# Patient Record
Sex: Female | Born: 1985
Health system: Southern US, Community
[De-identification: ages and names within clinical notes are randomized; demographics above are authoritative.]

## PROBLEM LIST (undated history)

## (undated) DIAGNOSIS — J449 Chronic obstructive pulmonary disease, unspecified: Secondary | ICD-10-CM

## (undated) DIAGNOSIS — T7840XA Allergy, unspecified, initial encounter: Secondary | ICD-10-CM

## (undated) DIAGNOSIS — I10 Essential (primary) hypertension: Secondary | ICD-10-CM

## (undated) DIAGNOSIS — Z720 Tobacco use: Secondary | ICD-10-CM

## (undated) DIAGNOSIS — K279 Peptic ulcer, site unspecified, unspecified as acute or chronic, without hemorrhage or perforation: Secondary | ICD-10-CM

## (undated) DIAGNOSIS — K219 Gastro-esophageal reflux disease without esophagitis: Secondary | ICD-10-CM

## (undated) DIAGNOSIS — I420 Dilated cardiomyopathy: Secondary | ICD-10-CM

## (undated) DIAGNOSIS — B019 Varicella without complication: Secondary | ICD-10-CM

## (undated) DIAGNOSIS — F1910 Other psychoactive substance abuse, uncomplicated: Secondary | ICD-10-CM

## (undated) DIAGNOSIS — G4730 Sleep apnea, unspecified: Secondary | ICD-10-CM

## (undated) DIAGNOSIS — J45909 Unspecified asthma, uncomplicated: Secondary | ICD-10-CM

## (undated) DIAGNOSIS — I509 Heart failure, unspecified: Secondary | ICD-10-CM

## (undated) DIAGNOSIS — F419 Anxiety disorder, unspecified: Secondary | ICD-10-CM

## (undated) DIAGNOSIS — F32A Depression, unspecified: Secondary | ICD-10-CM

## (undated) DIAGNOSIS — G709 Myoneural disorder, unspecified: Secondary | ICD-10-CM

## (undated) HISTORY — DX: Unspecified asthma, uncomplicated: J45.909

## (undated) HISTORY — DX: Depression, unspecified: F32.A

## (undated) HISTORY — DX: Chronic obstructive pulmonary disease, unspecified: J44.9

## (undated) HISTORY — DX: Varicella without complication: B01.9

## (undated) HISTORY — DX: Other psychoactive substance abuse, uncomplicated: F19.10

## (undated) HISTORY — PX: FOOT SURGERY: SHX648

## (undated) HISTORY — DX: Myoneural disorder, unspecified: G70.9

## (undated) HISTORY — DX: Tobacco use: Z72.0

## (undated) HISTORY — DX: Heart failure, unspecified: I50.9

## (undated) HISTORY — DX: Allergy, unspecified, initial encounter: T78.40XA

## (undated) HISTORY — DX: Anxiety disorder, unspecified: F41.9

## (undated) HISTORY — DX: Sleep apnea, unspecified: G47.30

## (undated) HISTORY — DX: Essential (primary) hypertension: I10

## (undated) HISTORY — DX: Gastro-esophageal reflux disease without esophagitis: K21.9

---
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
## 2005-01-30 ENCOUNTER — Ambulatory Visit: Payer: Self-pay | Admitting: Family Medicine

## 2005-01-31 ENCOUNTER — Ambulatory Visit: Payer: Self-pay | Admitting: Family Medicine

## 2005-03-30 ENCOUNTER — Ambulatory Visit: Payer: Self-pay | Admitting: Family Medicine

## 2005-05-12 ENCOUNTER — Ambulatory Visit: Payer: Self-pay | Admitting: Family Medicine

## 2005-12-05 ENCOUNTER — Emergency Department (HOSPITAL_COMMUNITY): Admission: EM | Admit: 2005-12-05 | Discharge: 2005-12-05 | Payer: Self-pay | Admitting: *Deleted

## 2005-12-12 ENCOUNTER — Emergency Department: Payer: Self-pay | Admitting: Emergency Medicine

## 2005-12-14 ENCOUNTER — Emergency Department: Payer: Self-pay | Admitting: Emergency Medicine

## 2006-07-07 ENCOUNTER — Emergency Department: Payer: Self-pay | Admitting: Emergency Medicine

## 2006-09-04 ENCOUNTER — Emergency Department: Payer: Self-pay | Admitting: Emergency Medicine

## 2006-11-14 ENCOUNTER — Emergency Department: Payer: Self-pay | Admitting: Emergency Medicine

## 2006-12-22 ENCOUNTER — Emergency Department: Payer: Self-pay | Admitting: Emergency Medicine

## 2007-08-01 ENCOUNTER — Emergency Department: Payer: Self-pay | Admitting: Emergency Medicine

## 2007-10-24 ENCOUNTER — Emergency Department: Payer: Self-pay | Admitting: Emergency Medicine

## 2008-01-14 ENCOUNTER — Emergency Department: Payer: Self-pay | Admitting: Emergency Medicine

## 2008-01-19 ENCOUNTER — Emergency Department: Payer: Self-pay | Admitting: Emergency Medicine

## 2008-02-14 ENCOUNTER — Ambulatory Visit: Payer: Self-pay | Admitting: Family Medicine

## 2008-02-14 DIAGNOSIS — Z720 Tobacco use: Secondary | ICD-10-CM | POA: Insufficient documentation

## 2008-02-14 DIAGNOSIS — F17200 Nicotine dependence, unspecified, uncomplicated: Secondary | ICD-10-CM | POA: Insufficient documentation

## 2008-02-14 LAB — CONVERTED CEMR LAB
BUN: 11 mg/dL (ref 6–23)
Basophils Absolute: 0.1 10*3/uL (ref 0.0–0.1)
Eosinophils Absolute: 0.4 10*3/uL (ref 0.0–0.7)
Eosinophils Relative: 6.7 % — ABNORMAL HIGH (ref 0.0–5.0)
GFR calc Af Amer: 161 mL/min
GFR calc non Af Amer: 133 mL/min
HCT: 38.8 % (ref 36.0–46.0)
MCHC: 34.7 g/dL (ref 30.0–36.0)
MCV: 91.7 fL (ref 78.0–100.0)
Monocytes Absolute: 0.5 10*3/uL (ref 0.1–1.0)
Neutrophils Relative %: 60.1 % (ref 43.0–77.0)
Platelets: 278 10*3/uL (ref 150–400)
Potassium: 3.6 meq/L (ref 3.5–5.1)
RDW: 12.2 % (ref 11.5–14.6)
TSH: 0.91 microintl units/mL (ref 0.35–5.50)

## 2008-03-06 DIAGNOSIS — J301 Allergic rhinitis due to pollen: Secondary | ICD-10-CM | POA: Insufficient documentation

## 2008-07-22 ENCOUNTER — Emergency Department: Payer: Self-pay | Admitting: Emergency Medicine

## 2009-08-14 ENCOUNTER — Emergency Department: Payer: Self-pay | Admitting: Emergency Medicine

## 2009-10-16 ENCOUNTER — Emergency Department: Payer: Self-pay | Admitting: Internal Medicine

## 2009-11-29 ENCOUNTER — Encounter (INDEPENDENT_AMBULATORY_CARE_PROVIDER_SITE_OTHER): Payer: Self-pay | Admitting: *Deleted

## 2010-05-26 NOTE — Letter (Signed)
Summary: Nadara Eaton letter  [place] [facility]  [address] [place], [place] [address]   Phone: [phone]  Fax: [phone]       11/29/2009 MRN: [id number]  [facility] [address] [place], [place]  [address]  Dear Ms. Ass[facility] [place], and [place] announce the retirement of Arta Silence, M.D., from full-time practice at the [facility] office effective October 21, 2009 and his plans of returning part-time.  It is important to Dr. Hetty Ely and to our practice that you understand that [facility] has seven physicians in our office for your health care needs.  We will continue to offer the same exceptional care that you have today.    Dr. Hetty Ely has spoken to many of you about his plans for retirement and returning part-time in the fall.   We will continue to work with you through the transition to schedule appointments for you in the office and meet the high standards that [place] is committed to.   Again, it is with great pleasure that we share the news that Dr. Hetty Ely will return to [facility] in October of 2011 with a reduced schedule.    If you have any questions, or would like to request an appointment with one of our physicians, please call us at [phone] and press the option for Scheduling an appointment.  We take pleasure in providing you with excellent patient care and look forward to seeing you at your next office visit.  Our [facility] Physicians are:  Tillman Abide, M.D. Laurita Quint, M.D. Roxy Manns, M.D. Kerby Nora, M.D. Hannah Beat, M.D. Ruthe Mannan, M.D. We proudly welcomed Raechel Ache, M.D. and Eustaquio Boyden, M.D. to the practice in July/August 2011.  Sincerely,  [place] [facility]

## 2012-09-25 ENCOUNTER — Emergency Department: Payer: Self-pay | Admitting: Emergency Medicine

## 2013-02-20 ENCOUNTER — Emergency Department: Payer: Self-pay | Admitting: Emergency Medicine

## 2013-02-20 LAB — URINALYSIS, COMPLETE
Glucose,UR: NEGATIVE mg/dL (ref 0–75)
Nitrite: NEGATIVE
Ph: 6 (ref 4.5–8.0)
RBC,UR: <1 /HPF (ref 0–5)
Squamous Epithelial: NONE SEEN

## 2013-02-22 LAB — BETA STREP CULTURE(ARMC)

## 2013-04-18 ENCOUNTER — Emergency Department: Payer: Self-pay | Admitting: Emergency Medicine

## 2013-06-11 ENCOUNTER — Emergency Department: Payer: Self-pay | Admitting: Emergency Medicine

## 2013-06-13 LAB — BETA STREP CULTURE(ARMC)

## 2013-07-31 ENCOUNTER — Emergency Department: Payer: Self-pay | Admitting: Emergency Medicine

## 2013-07-31 LAB — CBC WITH DIFFERENTIAL/PLATELET
BASOS ABS: 0.1 10*3/uL (ref 0.0–0.1)
Basophil %: 0.9 %
Eosinophil #: 0.6 10*3/uL (ref 0.0–0.7)
Eosinophil %: 4.7 %
HCT: 44.9 % (ref 35.0–47.0)
HGB: 14.6 g/dL (ref 12.0–16.0)
LYMPHS PCT: 23.5 %
Lymphocyte #: 2.9 10*3/uL (ref 1.0–3.6)
MCH: 30.1 pg (ref 26.0–34.0)
MCHC: 32.4 g/dL (ref 32.0–36.0)
MCV: 93 fL (ref 80–100)
Monocyte #: 0.8 x10 3/mm (ref 0.2–0.9)
Monocyte %: 6.9 %
Neutrophil #: 7.8 10*3/uL — ABNORMAL HIGH (ref 1.4–6.5)
Neutrophil %: 64 %
PLATELETS: 259 10*3/uL (ref 150–440)
RBC: 4.84 10*6/uL (ref 3.80–5.20)
RDW: 13.4 % (ref 11.5–14.5)
WBC: 12.2 10*3/uL — ABNORMAL HIGH (ref 3.6–11.0)

## 2013-07-31 LAB — COMPREHENSIVE METABOLIC PANEL
ALBUMIN: 4.4 g/dL (ref 3.4–5.0)
ALK PHOS: 73 U/L
ALT: 19 U/L (ref 12–78)
ANION GAP: 6 — AB (ref 7–16)
AST: 22 U/L (ref 15–37)
BILIRUBIN TOTAL: 0.2 mg/dL (ref 0.2–1.0)
BUN: 18 mg/dL (ref 7–18)
CALCIUM: 9 mg/dL (ref 8.5–10.1)
CHLORIDE: 109 mmol/L — AB (ref 98–107)
CO2: 23 mmol/L (ref 21–32)
Creatinine: 0.6 mg/dL (ref 0.60–1.30)
EGFR (Non-African Amer.): >60
GLUCOSE: 97 mg/dL (ref 65–99)
Osmolality: 277 (ref 275–301)
POTASSIUM: 3.8 mmol/L (ref 3.5–5.1)
SODIUM: 138 mmol/L (ref 136–145)
TOTAL PROTEIN: 8.1 g/dL (ref 6.4–8.2)

## 2013-07-31 LAB — LIPASE, BLOOD: Lipase: 100 U/L (ref 73–393)

## 2013-08-01 LAB — URINALYSIS, COMPLETE
Bilirubin,UR: NEGATIVE
Blood: NEGATIVE
GLUCOSE, UR: NEGATIVE mg/dL (ref 0–75)
Ketone: NEGATIVE
Nitrite: NEGATIVE
PH: 5 (ref 4.5–8.0)
PROTEIN: NEGATIVE
Specific Gravity: 1.025 (ref 1.003–1.030)
WBC UR: 4 /HPF (ref 0–5)

## 2013-10-21 ENCOUNTER — Emergency Department: Payer: Self-pay | Admitting: Emergency Medicine

## 2014-02-11 ENCOUNTER — Emergency Department: Payer: Self-pay | Admitting: Student

## 2014-07-02 ENCOUNTER — Emergency Department: Payer: Self-pay | Admitting: Emergency Medicine

## 2014-10-07 ENCOUNTER — Other Ambulatory Visit: Payer: Self-pay

## 2014-10-07 ENCOUNTER — Emergency Department: Payer: Self-pay

## 2014-10-07 ENCOUNTER — Encounter: Payer: Self-pay | Admitting: General Practice

## 2014-10-07 ENCOUNTER — Emergency Department
Admission: EM | Admit: 2014-10-07 | Discharge: 2014-10-07 | Disposition: A | Discharge status: Home / Self Care | Payer: Self-pay | Attending: Emergency Medicine | Admitting: Emergency Medicine

## 2014-10-07 DIAGNOSIS — Z3202 Encounter for pregnancy test, result negative: Secondary | ICD-10-CM | POA: Insufficient documentation

## 2014-10-07 DIAGNOSIS — R109 Unspecified abdominal pain: Secondary | ICD-10-CM | POA: Insufficient documentation

## 2014-10-07 DIAGNOSIS — Z720 Tobacco use: Secondary | ICD-10-CM | POA: Insufficient documentation

## 2014-10-07 LAB — URINALYSIS COMPLETE WITH MICROSCOPIC (ARMC ONLY)
Bacteria, UA: NONE SEEN
Bilirubin Urine: NEGATIVE
Glucose, UA: NEGATIVE mg/dL
LEUKOCYTES UA: NEGATIVE
NITRITE: NEGATIVE
PH: 6 (ref 5.0–8.0)
Protein, ur: NEGATIVE mg/dL
Specific Gravity, Urine: 1.023 (ref 1.005–1.030)
WBC, UA: NONE SEEN WBC/hpf (ref 0–5)

## 2014-10-07 LAB — TROPONIN I

## 2014-10-07 LAB — COMPREHENSIVE METABOLIC PANEL
ALK PHOS: 47 U/L (ref 38–126)
ALT: 14 U/L (ref 14–54)
ANION GAP: 9 (ref 5–15)
AST: 15 U/L (ref 15–41)
Albumin: 4.3 g/dL (ref 3.5–5.0)
BUN: 18 mg/dL (ref 6–20)
CO2: 25 mmol/L (ref 22–32)
CREATININE: 0.64 mg/dL (ref 0.44–1.00)
Calcium: 9.1 mg/dL (ref 8.9–10.3)
Chloride: 107 mmol/L (ref 101–111)
GFR calc non Af Amer: >60 mL/min (ref >60)
Glucose, Bld: 91 mg/dL (ref 65–99)
Potassium: 3.8 mmol/L (ref 3.5–5.1)
SODIUM: 141 mmol/L (ref 135–145)
TOTAL PROTEIN: 7.9 g/dL (ref 6.5–8.1)
Total Bilirubin: 0.5 mg/dL (ref 0.3–1.2)

## 2014-10-07 LAB — CBC WITH DIFFERENTIAL/PLATELET
Basophils Absolute: 0.1 10*3/uL (ref 0–0.1)
Basophils Relative: 1 %
EOS ABS: 0.3 10*3/uL (ref 0–0.7)
Eosinophils Relative: 3 %
HCT: 42.1 % (ref 35.0–47.0)
Hemoglobin: 14.2 g/dL (ref 12.0–16.0)
LYMPHS ABS: 1.7 10*3/uL (ref 1.0–3.6)
Lymphocytes Relative: 16 %
MCH: 31.8 pg (ref 26.0–34.0)
MCHC: 33.6 g/dL (ref 32.0–36.0)
MCV: 94.5 fL (ref 80.0–100.0)
MONOS PCT: 7 %
Monocytes Absolute: 0.7 10*3/uL (ref 0.2–0.9)
NEUTROS ABS: 7.7 10*3/uL — AB (ref 1.4–6.5)
Neutrophils Relative %: 73 %
PLATELETS: 231 10*3/uL (ref 150–440)
RBC: 4.45 MIL/uL (ref 3.80–5.20)
RDW: 13.7 % (ref 11.5–14.5)
WBC: 10.5 10*3/uL (ref 3.6–11.0)

## 2014-10-07 LAB — POCT PREGNANCY, URINE: Preg Test, Ur: NEGATIVE

## 2014-10-07 MED ORDER — DICYCLOMINE HCL 10 MG/ML IM SOLN
20.0000 mg | Freq: Once | INTRAMUSCULAR | Status: DC
  Filled 2014-10-07: qty 2

## 2014-10-07 MED ORDER — DICYCLOMINE HCL 20 MG PO TABS: 20.0000 mg | ORAL_TABLET | Freq: Three times a day (TID) | ORAL | Status: DC | PRN

## 2014-10-07 MED ORDER — SUCRALFATE 1 G PO TABS: 1.0000 g | ORAL_TABLET | Freq: Four times a day (QID) | ORAL | Status: DC

## 2014-10-07 MED ORDER — GI COCKTAIL ~~LOC~~
ORAL | Status: AC
  Administered 2014-10-07: 30 mL via ORAL
  Filled 2014-10-07: qty 30

## 2014-10-07 MED ORDER — DICYCLOMINE HCL 10 MG PO CAPS: 20.0000 mg | ORAL_CAPSULE | Freq: Once | ORAL | Status: AC

## 2014-10-07 MED ORDER — DICYCLOMINE HCL 20 MG PO TABS
ORAL_TABLET | ORAL | Status: AC
  Administered 2014-10-07: 20 mg
  Filled 2014-10-07: qty 1

## 2014-10-07 MED ORDER — RANITIDINE HCL 150 MG PO TABS: 150.0000 mg | ORAL_TABLET | Freq: Two times a day (BID) | ORAL | Status: DC

## 2014-10-07 MED ORDER — GI COCKTAIL ~~LOC~~
30.0000 mL | Freq: Once | ORAL | Status: AC
  Administered 2014-10-07: 30 mL via ORAL

## 2014-10-07 NOTE — ED Notes (Signed)
Pt returned from xray

## 2014-10-07 NOTE — ED Provider Notes (Signed)
[facility] Emergency Department Provider Note  ____________________________________________  Time seen: 69  I have reviewed the triage vital signs and the nursing notes.   HISTORY  Chief Complaint Abdominal Pain; Flank Pain; Shoulder Pain; Nausea; and Emesis   History limited by: Not Limited   HPI Bonnie Mathews is a 29 y.o. female who presents to the emergency department today because of left upper quadrant pain. The patient states that the pain started one month ago, has been fairly constant since then. It is worse when she eats. She will have immediate pain. She will proceed to throw up what she just ate. The pain radiates up to her shoulder and back. She has had slightly more watery stools since the pain started. Denies any fevers.       History reviewed. No pertinent past medical history.  Patient Active Problem List   Diagnosis Date Noted  . ALLERGIC RHINITIS, SEASONAL 03/06/2008  . TOBACCO ABUSE 02/14/2008    History reviewed. No pertinent past surgical history.  No current outpatient prescriptions on file.  Allergies Doxycycline  No family history on file.  Social History History  Substance Use Topics  . Smoking status: Current Every Day Smoker -- 1.00 packs/day    Types: Cigarettes  . Smokeless tobacco: Not on file  . Alcohol Use: 6.0 oz/week    10 Cans of beer per week    Review of Systems   Constitutional: Negative for fever. Cardiovascular: Negative for chest pain. Respiratory: Negative for shortness of breath. Gastrointestinal: Positive for left upper quadrant pain Genitourinary: Negative for dysuria. Musculoskeletal: Negative for back pain. Skin: Negative for rash. Neurological: Negative for headaches, focal weakness or numbness.    10-point ROS otherwise negative.  ____________________________________________   PHYSICAL EXAM:  VITAL SIGNS: ED Triage Vitals  Enc Vitals Group     BP 10/07/14 1710  125/76 mmHg     Pulse Rate 10/07/14 1710 83     Resp 10/07/14 1710 18     Temp 10/07/14 1710 97.8 F (36.6 C)     Temp Source 10/07/14 1710 Oral     SpO2 10/07/14 1710 100 %     Weight 10/07/14 1710 169 lb (76.658 kg)     Height 10/07/14 1710 5\' 3"  (1.6 m)     Head Cir --      Peak Flow --      Pain Score 10/07/14 1711 7   Constitutional: Alert and oriented. Well appearing and in no distress. Eyes: Conjunctivae are normal. PERRL. Normal extraocular movements. ENT   Head: Normocephalic and atraumatic.   Nose: No congestion/rhinnorhea.   Mouth/Throat: Mucous membranes are moist.   Neck: No stridor. Hematological/Lymphatic/Immunilogical: No cervical lymphadenopathy. Cardiovascular: Normal rate, regular rhythm.  No murmurs, rubs, or gallops. Respiratory: Normal respiratory effort without tachypnea nor retractions. Breath sounds are clear and equal bilaterally. No wheezes/rales/rhonchi. Gastrointestinal: Soft, tender to palpation in the left upper quadrant. No rebound. No guarding.  Genitourinary: Deferred Musculoskeletal: Normal range of motion in all extremities. No joint effusions.  No lower extremity tenderness nor edema. Neurologic:  Normal speech and language. No gross focal neurologic deficits are appreciated. Speech is normal.  Skin:  Skin is warm, dry and intact. No rash noted. Psychiatric: Mood and affect are normal. Speech and behavior are normal. Patient exhibits appropriate insight and judgment.  ____________________________________________    LABS (pertinent positives/negatives)  Labs Reviewed  CBC WITH DIFFERENTIAL/PLATELET - Abnormal; Notable for the following:    Neutro Abs 7.7 (*)  All other components within normal limits  URINALYSIS COMPLETEWITH MICROSCOPIC ([facility] ONLY) - Abnormal; Notable for the following:    Color, Urine YELLOW (*)    APPearance CLEAR (*)    Ketones, ur TRACE (*)    Hgb urine dipstick 2+ (*)    Squamous Epithelial / LPF 0-5  (*)    All other components within normal limits  COMPREHENSIVE METABOLIC PANEL  TROPONIN I  POC URINE PREG, ED  POCT PREGNANCY, URINE    ____________________________________________   EKG  I, Bonnie Mathews, attending physician, personally viewed and interpreted this EKG  EKG Time: 1718 Rate: 83 Rhythm: NSR Axis: normal Intervals: qtc 451 QRS: narrow ST changes: no st elevation    ____________________________________________    RADIOLOGY  Abdominal x-ray IMPRESSION: No plain film findings for an acute abdominal process.  ____________________________________________   PROCEDURES  Procedure(s) performed: None  Critical Care performed: No  ____________________________________________   INITIAL IMPRESSION / ASSESSMENT AND PLAN / ED COURSE  Pertinent labs & imaging results that were available during my care of the patient were reviewed by me and considered in my medical decision making (see chart for details).  Patient presents to the emergency department with left upper quadrant pain. She states the pain is worse after eating. Bedside ultrasound did not show any gallstones, gallbladder wall thickening or pericholecystic fluid. 2 abdominal x-ray was revealed without any concerning findings. Patient did not have leukocytosis. Will trial patient on medications. Did have discussion with patient about return precautions.  ____________________________________________   FINAL CLINICAL IMPRESSION(S) / ED DIAGNOSES  Final diagnoses:  Abdominal pain, unspecified abdominal location     Bonnie Pear, MD 10/07/14 2000

## 2014-10-07 NOTE — Discharge Instructions (Signed)
Please seek medical attention for any high fevers, chest pain, shortness of breath, change in behavior, persistent vomiting, bloody stool or any other new or concerning symptoms.  Abdominal Pain, Women Abdominal (stomach, pelvic, or belly) pain can be caused by many things. It is important to tell your doctor:  The location of the pain.  Does it come and go or is it present all the time?  Are there things that start the pain (eating certain foods, exercise)?  Are there other symptoms associated with the pain (fever, nausea, vomiting, diarrhea)? All of this is helpful to know when trying to find the cause of the pain. CAUSES   Stomach: virus or bacteria infection, or ulcer.  Intestine: appendicitis (inflamed appendix), regional ileitis (Crohn's disease), ulcerative colitis (inflamed colon), irritable bowel syndrome, diverticulitis (inflamed diverticulum of the colon), or cancer of the stomach or intestine.  Gallbladder disease or stones in the gallbladder.  Kidney disease, kidney stones, or infection.  Pancreas infection or cancer.  Fibromyalgia (pain disorder).  Diseases of the female organs:  Uterus: fibroid (non-cancerous) tumors or infection.  Fallopian tubes: infection or tubal pregnancy.  Ovary: cysts or tumors.  Pelvic adhesions (scar tissue).  Endometriosis (uterus lining tissue growing in the pelvis and on the pelvic organs).  Pelvic congestion syndrome (female organs filling up with blood just before the menstrual period).  Pain with the menstrual period.  Pain with ovulation (producing an egg).  Pain with an IUD (intrauterine device, birth control) in the uterus.  Cancer of the female organs.  Functional pain (pain not caused by a disease, may improve without treatment).  Psychological pain.  Depression. DIAGNOSIS  Your doctor will decide the seriousness of your pain by doing an examination.  Blood tests.  X-rays.  Ultrasound.  CT scan  (computed tomography, special type of X-ray).  MRI (magnetic resonance imaging).  Cultures, for infection.  Barium enema (dye inserted in the large intestine, to better view it with X-rays).  Colonoscopy (looking in intestine with a lighted tube).  Laparoscopy (minor surgery, looking in abdomen with a lighted tube).  Major abdominal exploratory surgery (looking in abdomen with a large incision). TREATMENT  The treatment will depend on the cause of the pain.   Many cases can be observed and treated at home.  Over-the-counter medicines recommended by your caregiver.  Prescription medicine.  Antibiotics, for infection.  Birth control pills, for painful periods or for ovulation pain.  Hormone treatment, for endometriosis.  Nerve blocking injections.  Physical therapy.  Antidepressants.  Counseling with a psychologist or psychiatrist.  Minor or major surgery. HOME CARE INSTRUCTIONS   Do not take laxatives, unless directed by your caregiver.  Take over-the-counter pain medicine only if ordered by your caregiver. Do not take aspirin because it can cause an upset stomach or bleeding.  Try a clear liquid diet (broth or water) as ordered by your caregiver. Slowly move to a bland diet, as tolerated, if the pain is related to the stomach or intestine.  Have a thermometer and take your temperature several times a day, and record it.  Bed rest and sleep, if it helps the pain.  Avoid sexual intercourse, if it causes pain.  Avoid stressful situations.  Keep your follow-up appointments and tests, as your caregiver orders.  If the pain does not go away with medicine or surgery, you may try:  Acupuncture.  Relaxation exercises (yoga, meditation).  Group therapy.  Counseling. SEEK MEDICAL CARE IF:   You notice certain foods cause stomach  pain.  Your home care treatment is not helping your pain.  You need stronger pain medicine.  You want your IUD removed.  You  feel faint or lightheaded.  You develop nausea and vomiting.  You develop a rash.  You are having side effects or an allergy to your medicine. SEEK IMMEDIATE MEDICAL CARE IF:   Your pain does not go away or gets worse.  You have a fever.  Your pain is felt only in portions of the abdomen. The right side could possibly be appendicitis. The left lower portion of the abdomen could be colitis or diverticulitis.  You are passing blood in your stools (bright red or black tarry stools, with or without vomiting).  You have blood in your urine.  You develop chills, with or without a fever.  You pass out. MAKE SURE YOU:   Understand these instructions.  Will watch your condition.  Will get help right away if you are not doing well or get worse. Document Released: 02/05/2007 Document Revised: 08/25/2013 Document Reviewed: 02/25/2009 [facility] Patient Information 2015 [place], [place]. This information is not intended to replace advice given to you by your health care provider. Make sure you discuss any questions you have with your health care provider.

## 2014-10-07 NOTE — ED Notes (Signed)
Pt alert and in NAD at time of d/c 

## 2014-10-07 NOTE — ED Notes (Signed)
Pt. Arrived to ed from home with reports of experiencing LLQ pain that radiates to left lower back and left shoulder. Pt reports intermittent pain x months. Pt alert and oriented. Pt reports experiencing N/V over the last months with pain. Pt describes pain as sharp/stabbing pain.

## 2014-10-09 ENCOUNTER — Encounter: Payer: Self-pay | Admitting: Emergency Medicine

## 2014-10-09 ENCOUNTER — Emergency Department
Admission: EM | Admit: 2014-10-09 | Discharge: 2014-10-09 | Disposition: A | Discharge status: Home / Self Care | Payer: Self-pay | Attending: Emergency Medicine | Admitting: Emergency Medicine

## 2014-10-09 DIAGNOSIS — Z720 Tobacco use: Secondary | ICD-10-CM | POA: Insufficient documentation

## 2014-10-09 DIAGNOSIS — M7631 Iliotibial band syndrome, right leg: Secondary | ICD-10-CM | POA: Insufficient documentation

## 2014-10-09 DIAGNOSIS — Z79899 Other long term (current) drug therapy: Secondary | ICD-10-CM | POA: Insufficient documentation

## 2014-10-09 MED ORDER — PREDNISONE 10 MG PO TABS: 10.0000 mg | ORAL_TABLET | Freq: Two times a day (BID) | ORAL | Status: DC

## 2014-10-09 MED ORDER — KETOROLAC TROMETHAMINE 60 MG/2ML IM SOLN
60.0000 mg | Freq: Once | INTRAMUSCULAR | Status: AC
  Administered 2014-10-09: 60 mg via INTRAMUSCULAR

## 2014-10-09 MED ORDER — KETOROLAC TROMETHAMINE 60 MG/2ML IM SOLN
INTRAMUSCULAR | Status: AC
  Filled 2014-10-09: qty 2

## 2014-10-09 MED ORDER — CYCLOBENZAPRINE HCL 5 MG PO TABS: 5.0000 mg | ORAL_TABLET | Freq: Three times a day (TID) | ORAL | Status: DC | PRN

## 2014-10-09 MED ORDER — ORPHENADRINE CITRATE 30 MG/ML IJ SOLN
INTRAMUSCULAR | Status: AC
  Administered 2014-10-09: 60 mg via INTRAMUSCULAR
  Filled 2014-10-09: qty 2

## 2014-10-09 MED ORDER — ORPHENADRINE CITRATE 30 MG/ML IJ SOLN
60.0000 mg | INTRAMUSCULAR | Status: AC
  Administered 2014-10-09: 60 mg via INTRAMUSCULAR

## 2014-10-09 NOTE — ED Notes (Signed)
Patient to ED with c/o right upper leg pain radiating from hip down to knee, reports pain started in knee and now is worse in hip. Patient reports movement makes hip pain worse but she is unsure of any injury.

## 2014-10-09 NOTE — ED Provider Notes (Signed)
[facility] Emergency Department Provider Note ____________________________________________  Time seen: 1824  I have reviewed the triage vital signs and the nursing notes.  HISTORY  Chief Complaint  Leg Pain  HPI Bonnie Mathews is a 29 y.o. female reports to the ED with pain on the lateral aspect of the right thigh since this morning. She denies injury, trauma, or accident. She noted onset after awakening this morning, after she had driven her brother to work and returned home. She notes increased pain with transitioning from sitting to standing, but denies any catch, click, LOC, or give way in the hip or knee joints.She notes the pain is from the right hip pointer to the lateral aspect of the right knee. She is without urinary incontinence or foot drop.  History reviewed. No pertinent past medical history.  Patient Active Problem List   Diagnosis Date Noted  . ALLERGIC RHINITIS, SEASONAL 03/06/2008  . TOBACCO ABUSE 02/14/2008   History reviewed. No pertinent past surgical history.  Current Outpatient Rx  Name  Route  Sig  Dispense  Refill  . cyclobenzaprine (FLEXERIL) 5 MG tablet   Oral   Take 1 tablet (5 mg total) by mouth every 8 (eight) hours as needed for muscle spasms.   12 tablet   0   . dicyclomine (BENTYL) 20 MG tablet   Oral   Take 1 tablet (20 mg total) by mouth 3 (three) times daily as needed for spasms.   30 tablet   0   . predniSONE (DELTASONE) 10 MG tablet   Oral   Take 1 tablet (10 mg total) by mouth 2 (two) times daily with a meal.   10 tablet   0   . ranitidine (ZANTAC) 150 MG tablet   Oral   Take 1 tablet (150 mg total) by mouth 2 (two) times daily.   60 tablet   1   . sucralfate (CARAFATE) 1 G tablet   Oral   Take 1 tablet (1 g total) by mouth 4 (four) times daily.   60 tablet   1    Allergies Doxycycline  History reviewed. No pertinent family history.  Social History History  Substance Use Topics  . Smoking  status: Current Every Day Smoker -- 1.00 packs/day    Types: Cigarettes  . Smokeless tobacco: Not on file  . Alcohol Use: 6.0 oz/week    10 Cans of beer per week   Review of Systems  Constitutional: Negative for fever. Eyes: Negative for visual changes. ENT: Negative for sore throat. Cardiovascular: Negative for chest pain. Respiratory: Negative for shortness of breath. Gastrointestinal: Negative for abdominal pain, vomiting and diarrhea. Genitourinary: Negative for dysuria. Musculoskeletal: Negative for back pain. Right thigh pain. Skin: Negative for rash. Neurological: Negative for headaches, focal weakness or numbness. ____________________________________________  PHYSICAL EXAM:  VITAL SIGNS: ED Triage Vitals  Enc Vitals Group     BP 10/09/14 1648 104/72 mmHg     Pulse Rate 10/09/14 1648 88     Resp 10/09/14 1648 20     Temp 10/09/14 1648 98.2 F (36.8 C)     Temp Source 10/09/14 1648 Oral     SpO2 10/09/14 1648 99 %     Weight 10/09/14 1648 169 lb (76.658 kg)     Height 10/09/14 1648 5\' 3"  (1.6 m)     Head Cir --      Peak Flow --      Pain Score 10/09/14 1648 8     Pain  Loc --      Pain Edu? --      Excl. in [place]? --    Constitutional: Alert and oriented. Well appearing and in no distress. Eyes: Normocephalic and atraumatic. Conjunctivae are normal. PERRL. Normal extraocular movements. No congestion/rhinnorhea. Mucous membranes are moist. Cardiovascular: Normal rate, regular rhythm.  Respiratory: Normal respiratory effort. No wheezes/rales/rhonchi. Gastrointestinal: Soft and nontender. No distention. Musculoskeletal: Normal spinal exam. Normal appearance of the right leg without deformity, bruising, swelling, or joint effusion. Tenderness to palp along the IT band on the right thigh. Self-limited hip flexion & extension, normal right knee ROM.  Neurologic:  Normal gait without ataxia. Normal speech and language. No gross focal neurologic deficits are appreciated.  Normal LE DTRs bilaterally. Negative SLR right.  Skin:  Skin is warm, dry and intact. No rash noted. Psychiatric: Mood and affect are normal. Patient exhibits appropriate insight and judgment. ____________________________________________  PROCEDURES  Toradol 60 mg IM Norflex 60 mg IM ____________________________________________  INITIAL IMPRESSION / ASSESSMENT AND PLAN / ED COURSE Acute right ITB tendinitis presentation. Treatment with Toradol, Flexeril, and stretches.  Follow-up with primary provider or ortho as needed.  ____________________________________________  FINAL CLINICAL IMPRESSION(S) / ED DIAGNOSES  Final diagnoses:  Tendinitis of iliotibial band, right     Melvenia Needles, PA-C 10/09/14 2042  Carrie Mew, MD 10/09/14 [phone]

## 2014-10-09 NOTE — Discharge Instructions (Signed)
Iliotibial Band Syndrome with Rehab The iliotibial (IT) band is a tendon that connects the hip muscles to the shinbone (tibia) and to one of the bones of the pelvis (ileum). The IT band passes by the knee and is often irritated by the outer portion of the knee (lateral femoral condyle). A fluid filled sac (bursa) exists between the tendon and the bone, to cushion and reduce friction. Overuse of the tendon may cause excessive friction, which results in IT band syndrome. This condition involves inflammation of the bursa (bursitis) and/or inflammation of the IT band (tendinitis). SYMPTOMS   Pain, tenderness, swelling, warmth, or redness over the IT band, at the outer knee (above the joint).  Pain that travels up or down the thigh or leg.  Initially, pain at the beginning of an exercise, that decreases once warmed up. Eventually, pain throughout the activity, getting worse as the activity continues. May cause the athlete to stop in the middle of training or competing.  Pain that gets worse when running down hills or stairs, on banked tracks, or next to the curb on the street.  Pain that increases when the foot of the affected leg hits the ground.  Possibly, a crackling sound (crepitation) when the tendon or bursa is moved or touched. CAUSES  IT band syndrome is caused by irritation of the IT band and the underlying bursa. This eventually results in inflammation and pain. IT band syndrome is an overuse injury.  RISK INCREASES WITH:  Sports with repetitive knee-bending activities (distance running, cycling).  Incorrect training techniques, including sudden changes in the intensity, frequency, or duration of training.  Not enough rest between workouts.  Poor strength and flexibility, especially a tight IT band.  Failure to warm up properly before activity.  Bow legs.  Arthritis of the knee. PREVENTION   Warm up and stretch properly before activity.  Allow for adequate recovery between  workouts.  Maintain physical fitness:  Strength, flexibility, and endurance.  Cardiovascular fitness.  Learn and use proper training technique, including reducing running mileage, shortening stride, and avoiding running on hills and banked surfaces.  Wear arch supports (orthotics), if you have flat feet. PROGNOSIS  If treated properly, IT band syndrome usually goes away within 6 weeks of treatment. RELATED COMPLICATIONS   Longer healing time, if not properly treated, or if not given enough time to heal.  Recurring inflammation of the tendon and bursa, that may result in a chronic condition.  Recurring symptoms, if activity is resumed too soon, with overuse, with a direct blow, or with poor training technique.  Inability to complete training or competition. TREATMENT  Treatment first involves the use of ice and medicine, to reduce pain and inflammation. The use of strengthening and stretching exercises may help reduce pain with activity. These exercises may be performed at home or with a therapist. For individuals with flat feet, an arch support (orthotic) may be helpful. Some individuals find that wearing a knee sleeve or compression bandage around the knee during workouts provides some relief. Certain training techniques, such as adjusting stride length, avoiding running on hills or stairs, changing the direction you run on a circular or banked track, or changing the side of the road you run on, if you run next to the curb, may help decrease symptoms of IT band syndrome. Cyclists may need to change the seat height or foot position on their bicycles. An injection of cortisone into the bursa may be recommended. Surgery to remove the inflamed bursa and/or part  of the IT band is only considered after at least 6 months of non-surgical treatment.  MEDICATION   If pain medicine is needed, nonsteroidal anti-inflammatory medicines (aspirin and ibuprofen), or other minor pain relievers  (acetaminophen), are often advised.  Do not take pain medicine for 7 days before surgery.  Prescription pain relievers may be given, if your caregiver thinks they are needed. Use only as directed and only as much as you need.  Corticosteroid injections may be given by your caregiver. These injections should be reserved for the most serious cases, because they may only be given a certain number of times. HEAT AND COLD  Cold treatment (icing) should be applied for 10 to 15 minutes every 2 to 3 hours for inflammation and pain, and immediately after activity that aggravates your symptoms. Use ice packs or an ice massage.  Heat treatment may be used before performing stretching and strengthening activities prescribed by your caregiver, physical therapist, or athletic trainer. Use a heat pack or a warm water soak. SEEK MEDICAL CARE IF:   Symptoms get worse or do not improve in 2 to 4 weeks, despite treatment.  New, unexplained symptoms develop. (Drugs used in treatment may produce side effects.) EXERCISES  RANGE OF MOTION (ROM) AND STRETCHING EXERCISES - Iliotibial Band Syndrome These exercises may help you when beginning to rehabilitate your injury. Your symptoms may go away with or without further involvement from your physician, physical therapist or athletic trainer. While completing these exercises, remember:   Restoring tissue flexibility helps normal motion to return to the joints. This allows healthier, less painful movement and activity.  An effective stretch should be held for at least 30 seconds.  A stretch should never be painful. You should only feel a gentle lengthening or release in the stretched tissue. STRETCH - Quadriceps, Prone   Lie on your stomach on a firm surface, such as a bed or padded floor.  Bend your right / left knee and grasp your ankle. If you are unable to reach your ankle or pant leg, use a belt around your foot to lengthen your reach.  Gently pull your heel  toward your buttocks. Your knee should not slide out to the side. You should feel a stretch in the front of your thigh and knee.  Hold this position for __________ seconds. Repeat __________ times. Complete this stretch __________ times per day.  STRETCH - Iliotibial Band  On the floor or bed, lie on your side, so your right / left leg is on top. Bend your knee and grab your ankle.  Slowly bring your knee back so that your thigh is in line with your trunk. Keep your heel at your buttocks and gently arch your back, so your head, shoulders and hips line up.  Slowly lower your leg so that your knee approaches the floor or bed, until you feel a gentle stretch on the outside of your right / left thigh. If you do not feel a stretch and your knee will not fall farther, place the heel of your opposite foot on top of your knee, and pull your thigh down farther.  Hold this stretch for __________ seconds. Repeat __________ times. Complete this stretch __________ times per day. STRENGTHENING EXERCISES - Iliotibial Band Syndrome Improving the flexibility of the IT band will best relieve your discomfort due to IT band syndrome. Strengthening exercises, however, can help improve both muscle endurance and joint mechanics, reducing the factors that can contribute to this condition. Your physician, physical  therapist or athletic trainer may provide you with exercises that train specific muscle groups that are especially weak. The following exercises target muscles that are often weak in people who have IT band syndrome. STRENGTH - Hip Abductors, Straight Leg Raises  Be aware of your form throughout the entire exercise, so that you exercise the correct muscles. Poor form means that you are not strengthening the correct muscles.  Lie on your side, so that your head, shoulders, knee and hip line up. You may bend your lower knee to help maintain your balance. Your right / left leg should be on top.  Roll your hips  slightly forward, so that your hips are stacked directly over each other and your right / left knee is facing forward.  Lift your top leg up 4-6 inches, leading with your heel. Be sure that your foot does not drift forward and that your knee does not roll toward the ceiling.  Hold this position for __________ seconds. You should feel the muscles in your outer hip lifting (you may not notice this until your leg begins to tire).  Slowly lower your leg to the starting position. Allow the muscles to fully relax before beginning the next repetition. Repeat __________ times. Complete this exercise __________ times per day.  STRENGTH - Quad/VMO, Isometric  Sit in a chair with your right / left knee slightly bent. With your fingertips, feel the VMO muscle (just above the inside of your knee). The VMO is important in controlling the position of your kneecap.  Keeping your fingertips on this muscle. Without actually moving your leg, attempt to drive your knee down, as if straightening your leg. You should feel your VMO tense. If you have a difficult time, you may wish to try the same exercise on your healthy knee first.  Tense this muscle as hard as you can, without increasing any knee pain.  Hold for __________ seconds. Relax the muscles slowly and completely between each repetition. Repeat __________ times. Complete this exercise __________ times per day.  Document Released: 04/10/2005 Document Revised: 07/03/2011 Document Reviewed: 07/23/2008 [facility] Patient Information 2015 [place], [place]. This information is not intended to replace advice given to you by your health care provider. Make sure you discuss any questions you have with your health care provider.  Take the prescription meds as directed.  Apply ice to reduce symptoms.  Follow-up with your provider of return to the ED for worsening symptoms.

## 2014-10-23 ENCOUNTER — Emergency Department
Admission: EM | Admit: 2014-10-23 | Discharge: 2014-10-23 | Disposition: A | Discharge status: Home / Self Care | Payer: Self-pay | Attending: Emergency Medicine | Admitting: Emergency Medicine

## 2014-10-23 ENCOUNTER — Emergency Department: Payer: Self-pay

## 2014-10-23 ENCOUNTER — Encounter: Payer: Self-pay | Admitting: Emergency Medicine

## 2014-10-23 DIAGNOSIS — Z79899 Other long term (current) drug therapy: Secondary | ICD-10-CM | POA: Insufficient documentation

## 2014-10-23 DIAGNOSIS — F17200 Nicotine dependence, unspecified, uncomplicated: Secondary | ICD-10-CM

## 2014-10-23 DIAGNOSIS — J209 Acute bronchitis, unspecified: Secondary | ICD-10-CM | POA: Insufficient documentation

## 2014-10-23 DIAGNOSIS — H9202 Otalgia, left ear: Secondary | ICD-10-CM | POA: Insufficient documentation

## 2014-10-23 DIAGNOSIS — Z720 Tobacco use: Secondary | ICD-10-CM | POA: Insufficient documentation

## 2014-10-23 HISTORY — DX: Peptic ulcer, site unspecified, unspecified as acute or chronic, without hemorrhage or perforation: K27.9

## 2014-10-23 MED ORDER — IPRATROPIUM-ALBUTEROL 0.5-2.5 (3) MG/3ML IN SOLN
RESPIRATORY_TRACT | Status: AC
  Administered 2014-10-23: 3 mL via RESPIRATORY_TRACT
  Filled 2014-10-23: qty 3

## 2014-10-23 MED ORDER — NEOMYCIN-POLYMYXIN-HC 3.5-10000-1 OT SOLN: 3.0000 [drp] | Freq: Three times a day (TID) | OTIC | Status: AC

## 2014-10-23 MED ORDER — AZITHROMYCIN 250 MG PO TABS: ORAL_TABLET | ORAL | Status: DC

## 2014-10-23 MED ORDER — ALBUTEROL SULFATE HFA 108 (90 BASE) MCG/ACT IN AERS: 2.0000 | INHALATION_SPRAY | Freq: Four times a day (QID) | RESPIRATORY_TRACT | Status: DC | PRN

## 2014-10-23 MED ORDER — IPRATROPIUM-ALBUTEROL 0.5-2.5 (3) MG/3ML IN SOLN
3.0000 mL | Freq: Once | RESPIRATORY_TRACT | Status: AC
  Administered 2014-10-23: 3 mL via RESPIRATORY_TRACT

## 2014-10-23 NOTE — ED Provider Notes (Signed)
[facility] Emergency Department Provider Note  ____________________________________________  Time QAST:4196  I have reviewed the triage vital signs and the nursing notes.   HISTORY  Chief Complaint Otalgia    HPI Bonnie Mathews is a 29 y.o. female is here with complaint of left earache since yesterday. Patient denies any fever but does have a productive cough that she has had for several weeks. Patient continues to smoke and drink on a daily basis. Left ear pain is constant, increased with touching her ear. She has not tried any over-the-counter medication for this.Patient has also had a cough for several weeks and has not tried any medication for this. She denies any fever or chills. She has history of bronchitis in the past.   Past Medical History  Diagnosis Date  . Peptic ulcer     Patient Active Problem List   Diagnosis Date Noted  . ALLERGIC RHINITIS, SEASONAL 03/06/2008  . TOBACCO ABUSE 02/14/2008    Past Surgical History  Procedure Laterality Date  . Tonsillectomy      Current Outpatient Rx  Name  Route  Sig  Dispense  Refill  . albuterol (PROVENTIL HFA;VENTOLIN HFA) 108 (90 BASE) MCG/ACT inhaler   Inhalation   Inhale 2 puffs into the lungs every 6 (six) hours as needed for wheezing or shortness of breath.   1 Inhaler   2   . azithromycin (ZITHROMAX Z-PAK) 250 MG tablet      Take 2 tablets (500 mg) on  Day 1,  followed by 1 tablet (250 mg) once daily on Days 2 through 5.   6 each   0   . cyclobenzaprine (FLEXERIL) 5 MG tablet   Oral   Take 1 tablet (5 mg total) by mouth every 8 (eight) hours as needed for muscle spasms.   12 tablet   0   . dicyclomine (BENTYL) 20 MG tablet   Oral   Take 1 tablet (20 mg total) by mouth 3 (three) times daily as needed for spasms.   30 tablet   0   . neomycin-polymyxin-hydrocortisone (CORTISPORIN) otic solution   Right Ear   Place 3 drops into the right ear 3 (three) times daily.   10 mL    0   . predniSONE (DELTASONE) 10 MG tablet   Oral   Take 1 tablet (10 mg total) by mouth 2 (two) times daily with a meal.   10 tablet   0   . ranitidine (ZANTAC) 150 MG tablet   Oral   Take 1 tablet (150 mg total) by mouth 2 (two) times daily.   60 tablet   1   . sucralfate (CARAFATE) 1 G tablet   Oral   Take 1 tablet (1 g total) by mouth 4 (four) times daily.   60 tablet   1     Allergies Doxycycline  No family history on file.  Social History History  Substance Use Topics  . Smoking status: Current Every Day Smoker -- 1.00 packs/day    Types: Cigarettes  . Smokeless tobacco: Not on file  . Alcohol Use: 1.8 oz/week    3 Cans of beer per week    Review of Systems Constitutional: No fever/chills Eyes: No visual changes. ENT: No sore throat. Positive for left ear pain Cardiovascular: Denies chest pain. Respiratory: Denies shortness of breath. Positive cough Gastrointestinal: No abdominal pain.  No nausea, no vomiting.  Genitourinary: Negative for dysuria. Musculoskeletal: Negative for back pain. Skin: Negative for rash. Neurological:  Negative for headaches, focal weakness or numbness.  10-point ROS otherwise negative.  ____________________________________________   PHYSICAL EXAM:  VITAL SIGNS: ED Triage Vitals  Enc Vitals Group     BP --      Pulse --      Resp --      Temp --      Temp src --      SpO2 --      Weight --      Height --      Head Cir --      Peak Flow --      Pain Score --      Pain Loc --      Pain Edu? --      Excl. in [place]? --     Constitutional: Alert and oriented. Well appearing and in no acute distress. Eyes: Conjunctivae are normal. PERRL. EOMI. Head: Atraumatic. Nose: No congestion/rhinnorhea. Bilateral EACs are clear, TMs are dull bilaterally. No erythema noted on the left TM Mouth/Throat: Mucous membranes are moist.  Oropharynx non-erythematous. Neck: No stridor.  Supple Hematological/Lymphatic/Immunilogical: No  cervical lymphadenopathy. Cardiovascular: Normal rate, regular rhythm. Grossly normal heart sounds.  Good peripheral circulation. Respiratory: Normal respiratory effort.  No retractions. Lungs bilateral expiratory wheezes throughout. Patient is able to talk in full sentences without any difficulty. Gastrointestinal: Soft and nontender. No distention.. Musculoskeletal: No lower extremity tenderness nor edema.  No joint effusions. Neurologic:  Normal speech and language. No gross focal neurologic deficits are appreciated. Speech is normal. No gait instability. Skin:  Skin is warm, dry and intact. No rash noted. Psychiatric: Mood and affect are normal. Speech and behavior are normal.  ____________________________________________   LABS (all labs ordered are listed, but only abnormal results are displayed)  Labs Reviewed - No data to display   _RADIOLOGY  Chest x-ray shows peribronchial thickening which may relate to bronchitis and smoking per radiologist and reviewed by me. I, Johnn Hai, personally viewed and evaluated these images as part of my medical decision making.  ____________________________________________   PROCEDURES  Procedure(s) performed: None  Critical Care performed: No  ____________________________________________   INITIAL IMPRESSION / ASSESSMENT AND PLAN / ED COURSE  Pertinent labs & imaging results that were available during my care of the patient were reviewed by me and considered in my medical decision making (see chart for details patient was encouraged not to smoke. She is started on a Proventil inhaler, Zithromax, and Cortisporin otic suspension. Patient is follow-up with her doctor at [facility] if any continued problems.   FINAL CLINICAL IMPRESSION(S) / ED DIAGNOSES  Final diagnoses:  Acute bronchitis, unspecified organism  Acute ear pain, left  Smoker      Johnn Hai, PA-C 10/23/14 1542  Ahmed Prima, MD 10/24/14 2251

## 2014-10-23 NOTE — ED Notes (Signed)
Patient transported to X-ray 

## 2014-10-23 NOTE — ED Notes (Signed)
Ptient c/o left earache since yesterday

## 2015-03-06 ENCOUNTER — Emergency Department: Payer: Self-pay

## 2015-03-06 ENCOUNTER — Emergency Department
Admission: EM | Admit: 2015-03-06 | Discharge: 2015-03-06 | Disposition: A | Discharge status: Home / Self Care | Payer: Self-pay | Attending: Emergency Medicine | Admitting: Emergency Medicine

## 2015-03-06 ENCOUNTER — Encounter: Payer: Self-pay | Admitting: Emergency Medicine

## 2015-03-06 DIAGNOSIS — Z79899 Other long term (current) drug therapy: Secondary | ICD-10-CM | POA: Insufficient documentation

## 2015-03-06 DIAGNOSIS — Z7952 Long term (current) use of systemic steroids: Secondary | ICD-10-CM | POA: Insufficient documentation

## 2015-03-06 DIAGNOSIS — M5416 Radiculopathy, lumbar region: Secondary | ICD-10-CM | POA: Insufficient documentation

## 2015-03-06 DIAGNOSIS — Z720 Tobacco use: Secondary | ICD-10-CM | POA: Insufficient documentation

## 2015-03-06 MED ORDER — ORPHENADRINE CITRATE 30 MG/ML IJ SOLN
60.0000 mg | Freq: Two times a day (BID) | INTRAMUSCULAR | Status: DC
  Administered 2015-03-06: 60 mg via INTRAMUSCULAR
  Filled 2015-03-06: qty 2

## 2015-03-06 MED ORDER — KETOROLAC TROMETHAMINE 60 MG/2ML IM SOLN
60.0000 mg | Freq: Once | INTRAMUSCULAR | Status: AC
  Administered 2015-03-06: 60 mg via INTRAMUSCULAR
  Filled 2015-03-06: qty 2

## 2015-03-06 MED ORDER — DEXAMETHASONE SODIUM PHOSPHATE 10 MG/ML IJ SOLN
10.0000 mg | Freq: Once | INTRAMUSCULAR | Status: AC
  Administered 2015-03-06: 10 mg via INTRAMUSCULAR
  Filled 2015-03-06: qty 1

## 2015-03-06 MED ORDER — METHYLPREDNISOLONE 4 MG PO TBPK: ORAL_TABLET | ORAL | Status: DC

## 2015-03-06 MED ORDER — TRAMADOL HCL 50 MG PO TABS: 50.0000 mg | ORAL_TABLET | Freq: Four times a day (QID) | ORAL | Status: DC | PRN

## 2015-03-06 MED ORDER — ONDANSETRON 8 MG PO TBDP
8.0000 mg | ORAL_TABLET | Freq: Once | ORAL | Status: AC
  Administered 2015-03-06: 8 mg via ORAL
  Filled 2015-03-06: qty 1

## 2015-03-06 MED ORDER — CYCLOBENZAPRINE HCL 10 MG PO TABS: 10.0000 mg | ORAL_TABLET | Freq: Three times a day (TID) | ORAL | Status: DC | PRN

## 2015-03-06 NOTE — ED Notes (Signed)
Urine Pregnancy Test : NEGATIVE  HCG Cassette Rapid Test Control : (+) Catalog Number : [id number] Lot : [id number] Exp: 2018/01

## 2015-03-06 NOTE — Discharge Instructions (Signed)
Radicular Pain °Radicular pain in either the arm or leg is usually from a bulging or herniated disk in the spine. A piece of the herniated disk may press against the nerves as the nerves exit the spine. This causes pain which is felt at the tips of the nerves down the arm or leg. Other causes of radicular pain may include: °· Fractures. °· Heart disease. °· Cancer. °· An abnormal and usually degenerative state of the nervous system or nerves (neuropathy). °Diagnosis may require CT or MRI scanning to determine the primary cause.  °Nerves that start at the neck (nerve roots) may cause radicular pain in the outer shoulder and arm. It can spread down to the thumb and fingers. The symptoms vary depending on which nerve root has been affected. In most cases radicular pain improves with conservative treatment. Neck problems may require physical therapy, a neck collar, or cervical traction. Treatment may take many weeks, and surgery may be considered if the symptoms do not improve.  °Conservative treatment is also recommended for sciatica. Sciatica causes pain to radiate from the lower back or buttock area down the leg into the foot. Often there is a history of back problems. Most patients with sciatica are better after 2 to 4 weeks of rest and other supportive care. Short term bed rest can reduce the disk pressure considerably. Sitting, however, is not a good position since this increases the pressure on the disk. You should avoid bending, lifting, and all other activities which make the problem worse. Traction can be used in severe cases. Surgery is usually reserved for patients who do not improve within the first months of treatment. °Only take over-the-counter or prescription medicines for pain, discomfort, or fever as directed by your caregiver. Narcotics and muscle relaxants may help by relieving more severe pain and spasm and by providing mild sedation. Cold or massage can give significant relief. Spinal manipulation  is not recommended. It can increase the degree of disc protrusion. Epidural steroid injections are often effective treatment for radicular pain. These injections deliver medicine to the spinal nerve in the space between the protective covering of the spinal cord and back bones (vertebrae). Your caregiver can give you more information about steroid injections. These injections are most effective when given within two weeks of the onset of pain.  °You should see your caregiver for follow up care as recommended. A program for neck and back injury rehabilitation with stretching and strengthening exercises is an important part of management.  °SEEK IMMEDIATE MEDICAL CARE IF: °· You develop increased pain, weakness, or numbness in your arm or leg. °· You develop difficulty with bladder or bowel control. °· You develop abdominal pain. °  °This information is not intended to replace advice given to you by your health care provider. Make sure you discuss any questions you have with your health care provider. °  °Document Released: 05/18/2004 Document Revised: 05/01/2014 Document Reviewed: 11/04/2014 °Elsevier Interactive Patient Education ©2016 [employer] ° °

## 2015-03-06 NOTE — ED Notes (Addendum)
69 yof w c/c low back pain/tightness for a couple weeks. Woke up this morning with pain going down her left leg. No incontinence of urine or bowel.

## 2015-03-06 NOTE — ED Provider Notes (Signed)
[facility] Emergency Department Provider Note  ____________________________________________  Time seen: Approximately 8:09 AM  I have reviewed the triage vital signs and the nursing notes.   HISTORY  Chief Complaint Back Pain    HPI Bonnie Mathews is a 29 y.o. female patient complaining of 2-3 weeks of low back pain. Patient states woke this morning with radicular pain down her left leg. Patient states she needed assistance getting out of bed and prefers to stand instead of sitting since awakening this morning. Patient denies any bladder or bowel dysfunction.Patient stated the pain starts at the left paraspinal muscle and radiates down the left leg. Patient stated intermitting pain and then numbness. No palliative measures taken for this complaint. Patient rated the pain as a 10 over 10.   Past Medical History  Diagnosis Date  . Peptic ulcer     Patient Active Problem List   Diagnosis Date Noted  . ALLERGIC RHINITIS, SEASONAL 03/06/2008  . TOBACCO ABUSE 02/14/2008    Past Surgical History  Procedure Laterality Date  . Tonsillectomy      Current Outpatient Rx  Name  Route  Sig  Dispense  Refill  . albuterol (PROVENTIL HFA;VENTOLIN HFA) 108 (90 BASE) MCG/ACT inhaler   Inhalation   Inhale 2 puffs into the lungs every 6 (six) hours as needed for wheezing or shortness of breath.   1 Inhaler   2   . azithromycin (ZITHROMAX Z-PAK) 250 MG tablet      Take 2 tablets (500 mg) on  Day 1,  followed by 1 tablet (250 mg) once daily on Days 2 through 5.   6 each   0   . cyclobenzaprine (FLEXERIL) 10 MG tablet   Oral   Take 1 tablet (10 mg total) by mouth every 8 (eight) hours as needed for muscle spasms.   15 tablet   0   . cyclobenzaprine (FLEXERIL) 5 MG tablet   Oral   Take 1 tablet (5 mg total) by mouth every 8 (eight) hours as needed for muscle spasms.   12 tablet   0   . dicyclomine (BENTYL) 20 MG tablet   Oral   Take 1 tablet (20 mg  total) by mouth 3 (three) times daily as needed for spasms.   30 tablet   0   . methylPREDNISolone (MEDROL DOSEPAK) 4 MG TBPK tablet      Take Tapered dose as directed   21 tablet   0   . predniSONE (DELTASONE) 10 MG tablet   Oral   Take 1 tablet (10 mg total) by mouth 2 (two) times daily with a meal.   10 tablet   0   . ranitidine (ZANTAC) 150 MG tablet   Oral   Take 1 tablet (150 mg total) by mouth 2 (two) times daily.   60 tablet   1   . sucralfate (CARAFATE) 1 G tablet   Oral   Take 1 tablet (1 g total) by mouth 4 (four) times daily.   60 tablet   1   . traMADol (ULTRAM) 50 MG tablet   Oral   Take 1 tablet (50 mg total) by mouth every 6 (six) hours as needed for moderate pain.   12 tablet   0     Allergies Doxycycline  History reviewed. No pertinent family history.  Social History Social History  Substance Use Topics  . Smoking status: Current Every Day Smoker -- 1.00 packs/day    Types: Cigarettes  .  Smokeless tobacco: None  . Alcohol Use: 1.8 oz/week    3 Cans of beer per week   Review of Systems Constitutional: No fever/chills Eyes: No visual changes. ENT: No sore throat. Cardiovascular: Denies chest pain. Respiratory: Denies shortness of breath. Gastrointestinal: No abdominal pain.  No nausea, no vomiting.  No diarrhea.  No constipation. Genitourinary: Negative for dysuria. Musculoskeletal: Radicular left back pain Skin: Negative for rash. Neurological: Negative for headaches, focal weakness or numbness.  10-point ROS otherwise negative.  ____________________________________________   PHYSICAL EXAM:  VITAL SIGNS: ED Triage Vitals  Enc Vitals Group     BP --      Pulse --      Resp --      Temp --      Temp src --      SpO2 --      Weight --      Height --      Head Cir --      Peak Flow --      Pain Score --      Pain Loc --      Pain Edu? --      Excl. in [place]? --    Constitutional: Alert and oriented. Moderate distress  Eyes: Conjunctivae are normal. PERRL. EOMI. Head: Atraumatic. Nose: No congestion/rhinnorhea. Mouth/Throat: Mucous membranes are moist.  Oropharynx non-erythematous. Neck: No stridor.  No cervical spine tenderness to palpation. Hematological/Lymphatic/Immunilogical: No cervical lymphadenopathy. Cardiovascular: Normal rate, regular rhythm. Grossly normal heart sounds.  Good peripheral circulation. Respiratory: Normal respiratory effort.  No retractions. Lungs CTAB. Gastrointestinal: Soft and nontender. No distention. No abdominal bruits. No CVA tenderness. Musculoskeletal: Spinal deformity however the patient stays in a flexed position. Patient prefers standing instead of sitting. Patient has some moderate guarding palpation of the paraspinal muscle group. Patient has decreased range of motion with extension which increased to spasm in the left paraspinal muscle area. Patient has a positive straight leg raises 70. Neurologic:  Normal speech and language. No gross focal neurologic deficits are appreciated. No gait instability. Skin:  Skin is warm, dry and intact. No rash noted. Psychiatric: Mood and affect are normal. Speech and behavior are normal.  ____________________________________________   LABS (all labs ordered are listed, but only abnormal results are displayed)  Labs Reviewed - No data to display ____________________________________________  EKG   ____________________________________________  RADIOLOGY  No acute findings on lumbar x-ray ____________________________________________   PROCEDURES  Procedure(s) performed: None  Critical Care performed: No  ____________________________________________   INITIAL IMPRESSION / ASSESSMENT AND PLAN / ED COURSE  Pertinent labs & imaging results that were available during my care of the patient were reviewed by me and considered in my medical decision making (see chart for details).  Radicular back pain. Patient get a  prescription for prednisone, Flexeril, and tramadol. Patient given a work note. Patient [facility]. Return back near her condition worsens. ____________________________________________   FINAL CLINICAL IMPRESSION(S) / ED DIAGNOSES  Final diagnoses:  Acute radicular low back pain      Sable Feil, PA-C 03/06/15 0920  Delman Kitten, MD 03/06/15 [phone]

## 2015-05-19 ENCOUNTER — Ambulatory Visit (INDEPENDENT_AMBULATORY_CARE_PROVIDER_SITE_OTHER): Payer: BLUE CROSS/BLUE SHIELD | Admitting: Primary Care

## 2015-05-19 ENCOUNTER — Encounter: Payer: Self-pay | Admitting: Primary Care

## 2015-05-19 VITALS — BP 122/76 | HR 83 | Temp 98.6°F | Ht 64.0 in | Wt 167.8 lb

## 2015-05-19 DIAGNOSIS — F17200 Nicotine dependence, unspecified, uncomplicated: Secondary | ICD-10-CM | POA: Diagnosis not present

## 2015-05-19 DIAGNOSIS — K279 Peptic ulcer, site unspecified, unspecified as acute or chronic, without hemorrhage or perforation: Secondary | ICD-10-CM | POA: Insufficient documentation

## 2015-05-19 DIAGNOSIS — R1012 Left upper quadrant pain: Secondary | ICD-10-CM | POA: Diagnosis not present

## 2015-05-19 LAB — COMPREHENSIVE METABOLIC PANEL
ALK PHOS: 48 U/L (ref 39–117)
ALT: 18 U/L (ref 0–35)
AST: 15 U/L (ref 0–37)
Albumin: 4.3 g/dL (ref 3.5–5.2)
BILIRUBIN TOTAL: 0.4 mg/dL (ref 0.2–1.2)
BUN: 12 mg/dL (ref 6–23)
CO2: 27 mEq/L (ref 19–32)
CREATININE: 0.59 mg/dL (ref 0.40–1.20)
Calcium: 9.4 mg/dL (ref 8.4–10.5)
Chloride: 104 mEq/L (ref 96–112)
GFR: 127.81 mL/min (ref >60.00)
GLUCOSE: 94 mg/dL (ref 70–99)
POTASSIUM: 4 meq/L (ref 3.5–5.1)
SODIUM: 137 meq/L (ref 135–145)
TOTAL PROTEIN: 7.4 g/dL (ref 6.0–8.3)

## 2015-05-19 LAB — H. PYLORI ANTIBODY, IGG: H PYLORI IGG: POSITIVE — AB

## 2015-05-19 NOTE — Progress Notes (Signed)
Pre visit review using our clinic review tool, if applicable. No additional management support is needed unless otherwise documented below in the visit note. 

## 2015-05-19 NOTE — Assessment & Plan Note (Addendum)
Diagnosed in ED during summer 2016. Recurrent bouts of LUQ pain after eating, followed by vomiting hours later. Exam unremarkable today. Discussed importance of tobacco cessation with PUD. Will test for H.Pylori and other metabolic causes. Will also start low dose PPI for 4 weeks. She is to email me with an update in 4 week. If no improvement, will consider sending to GI for endoscopy.

## 2015-05-19 NOTE — Assessment & Plan Note (Signed)
Not interested in quitting. Discussed that tobacco abuse can irritate stomach lining causing PUD. She verbalized understanding.

## 2015-05-19 NOTE — Patient Instructions (Signed)
Complete lab work prior to leaving today. I will notify you of your results once received.   Start taking omeprazole 20 mg every day for a total of 4 weeks. Update me through My Chart in 4 weeks regarding the symptoms we discussed.  Please schedule a physical with me in 6 months. You may also schedule a lab only appointment 3-4 days prior. We will discuss your lab results in detail during your physical.  It was a pleasure to meet you today! Please don't hesitate to call me with any questions. Welcome to [facility]!  Peptic Ulcer A peptic ulcer is a sore in the lining of your esophagus (esophageal ulcer), stomach (gastric ulcer), or in the first part of your small intestine (duodenal ulcer). The ulcer causes erosion into the deeper tissue. CAUSES  Normally, the lining of the stomach and the small intestine protects itself from the acid that digests food. The protective lining can be damaged by:  An infection caused by a bacterium called Helicobacter pylori (H. pylori).  Regular use of nonsteroidal anti-inflammatory drugs (NSAIDs), such as ibuprofen or aspirin.  Smoking tobacco. Other risk factors include being older than 86, drinking alcohol excessively, and having a family history of ulcer disease.  SYMPTOMS   Burning pain or gnawing in the area between the chest and the belly button.  Heartburn.  Nausea and vomiting.  Bloating. The pain can be worse on an empty stomach and at night. If the ulcer results in bleeding, it can cause:  Black, tarry stools.  Vomiting of bright red blood.  Vomiting of coffee-ground-looking materials. DIAGNOSIS  A diagnosis is usually made based upon your history and an exam. Other tests and procedures may be performed to find the cause of the ulcer. Finding a cause will help determine the best treatment. Tests and procedures may include:  Blood tests, stool tests, or breath tests to check for the bacterium H. pylori.  An upper gastrointestinal (GI)  series of the esophagus, stomach, and small intestine.  An endoscopy to examine the esophagus, stomach, and small intestine.  A biopsy. TREATMENT  Treatment may include:  Eliminating the cause of the ulcer, such as smoking, NSAIDs, or alcohol.  Medicines to reduce the amount of acid in your digestive tract.  Antibiotic medicines if the ulcer is caused by the H. pylori bacterium.  An upper endoscopy to treat a bleeding ulcer.  Surgery if the bleeding is severe or if the ulcer created a hole somewhere in the digestive system. HOME CARE INSTRUCTIONS   Avoid tobacco, alcohol, and caffeine. Smoking can increase the acid in the stomach, and continued smoking will impair the healing of ulcers.  Avoid foods and drinks that seem to cause discomfort or aggravate your ulcer.  Only take medicines as directed by your caregiver. Do not substitute over-the-counter medicines for prescription medicines without talking to your caregiver.  Keep any follow-up appointments and tests as directed. SEEK MEDICAL CARE IF:   Your do not improve within 7 days of starting treatment.  You have ongoing indigestion or heartburn. SEEK IMMEDIATE MEDICAL CARE IF:   You have sudden, sharp, or persistent abdominal pain.  You have bloody or dark black, tarry stools.  You vomit blood or vomit that looks like coffee grounds.  You become light-headed, weak, or feel faint.  You become sweaty or clammy. MAKE SURE YOU:   Understand these instructions.  Will watch your condition.  Will get help right away if you are not doing well or get worse.  This information is not intended to replace advice given to you by your health care provider. Make sure you discuss any questions you have with your health care provider.   Document Released: 04/07/2000 Document Revised: 05/01/2014 Document Reviewed: 11/08/2011 Elsevier Interactive Patient Education 2016 [place] for Peptic Ulcer  Disease When you have peptic ulcer disease, the foods you eat and your eating habits are very important. Choosing the right foods can help ease the discomfort of peptic ulcer disease. WHAT GENERAL GUIDELINES DO I NEED TO FOLLOW?  Choose fruits, vegetables, whole grains, and low-fat meat, fish, and poultry.   Keep a food diary to identify foods that cause symptoms.  Avoid foods that cause irritation or pain. These may be different for different people.  Eat frequent small meals instead of three large meals each day. The pain may be worse when your stomach is empty.  Avoid eating close to bedtime. WHAT FOODS ARE NOT RECOMMENDED? The following are some foods and drinks that may worsen your symptoms:  Black, white, and red pepper.  Hot sauce.  Chili peppers.  Chili powder.  Chocolate and cocoa.   Alcohol.  Tea, coffee, and cola (regular and decaffeinated). The items listed above may not be a complete list of foods and beverages to avoid. Contact your dietitian for more information.   This information is not intended to replace advice given to you by your health care provider. Make sure you discuss any questions you have with your health care provider.   Document Released: 07/03/2011 Document Revised: 04/15/2013 Document Reviewed: 02/12/2013 Elsevier Interactive Patient Education [employer].

## 2015-05-19 NOTE — Progress Notes (Signed)
Subjective:    Patient ID: Bonnie Mathews, female    DOB: June 28, 1985, 30 y.o.   MRN: [id number]  HPI  Bonnie Mathews is a 30 year old female who presents today to establish care and discuss the problems mentioned below. Will obtain old records.  1) Abdominal Pain: Ongoing, intermittent LUQ and LLQ pain for the past 6 months. Diagnosed with peptic ulcer disease and gastritis in summer of 2016. She's worked to improve her diet by limiting her greasy, fried foods, bacon, tomato sauses.    Her episodes will begin with with 1 hour after eating, then will have numerous episodes of vomiting about 8 hours later. Occasional diarrhea. She's had 10-15 epidoses of this since summer of 2016. She will occasionally take Zantac but has not noticed a difference when doing so, and has not taken consistently.   2) Ear pain: Located to the left ear with radiation down to left jaw. Her pain has been present for the past 3-4 days. Reports post nasal drip, chills. Denies fevers, cough, nasal congestion. She's not taken anything OTC for her symptoms. She is a current smoker.  Review of Systems  Constitutional: Negative for unexpected weight change.  HENT: Positive for ear pain and postnasal drip. Negative for congestion, rhinorrhea and sore throat.   Respiratory: Negative for cough and shortness of breath.   Cardiovascular: Negative for chest pain.  Gastrointestinal: Positive for nausea, vomiting, abdominal pain and abdominal distention. Negative for diarrhea, constipation and blood in stool.  Genitourinary:       Regular periods  Musculoskeletal: Negative for myalgias and arthralgias.  Skin: Negative for rash.  Allergic/Immunologic: Positive for environmental allergies.  Neurological: Negative for dizziness, numbness and headaches.  Psychiatric/Behavioral:       Denies concerns for anxiety or depression       Past Medical History  Diagnosis Date  . Peptic ulcer   . Chickenpox   . GERD (gastroesophageal  reflux disease)     Social History   Social History  . Marital Status: Single    Spouse Name: N/A  . Number of Children: N/A  . Years of Education: N/A   Occupational History  . Not on file.   Social History Main Topics  . Smoking status: Current Every Day Smoker -- 1.00 packs/day    Types: Cigarettes  . Smokeless tobacco: Not on file  . Alcohol Use: 1.8 oz/week    3 Cans of beer per week  . Drug Use: No  . Sexual Activity: Not on file   Other Topics Concern  . Not on file   Social History Narrative   Single.   1 child.   Works as a Physiological scientist   Enjoys working out and watching TV.   She's working on her GED.     Past Surgical History  Procedure Laterality Date  . Tonsillectomy and adenoidectomy  1993    Family History  Problem Relation Age of Onset  . Arthritis Mother   . Heart disease Mother   . Diabetes Mother   . Hypertension Father   . Arthritis Maternal Aunt   . Breast cancer Maternal Aunt   . Alcohol abuse Paternal Uncle   . Mental illness Maternal Grandmother   . Mental illness Maternal Grandfather   . Colon cancer Paternal Grandmother   . Mental illness Paternal Grandmother   . Alcohol abuse Paternal Grandfather   . Mental illness Paternal Grandfather     Allergies  Allergen Reactions  . Doxycycline Shortness  Of Breath    Current Outpatient Prescriptions on File Prior to Visit  Medication Sig Dispense Refill  . albuterol (PROVENTIL HFA;VENTOLIN HFA) 108 (90 BASE) MCG/ACT inhaler Inhale 2 puffs into the lungs every 6 (six) hours as needed for wheezing or shortness of breath. 1 Inhaler 2  . traMADol (ULTRAM) 50 MG tablet Take 1 tablet (50 mg total) by mouth every 6 (six) hours as needed for moderate pain. (Patient not taking: Reported on 05/19/2015) 12 tablet 0   No current facility-administered medications on file prior to visit.    BP 122/76 mmHg  Pulse 83  Temp(Src) 98.6 F (37 C) (Oral)  Ht 5\' 4"  (1.626 m)  Wt 167 lb 12.8 oz (76.114  kg)  BMI 28.79 kg/m2  SpO2 98%  LMP 05/11/2015    Objective:   Physical Exam  Constitutional: She is oriented to person, place, and time. She appears well-nourished.  HENT:  Right Ear: Tympanic membrane and ear canal normal.  Left Ear: Tympanic membrane and ear canal normal.  Nose: Nose normal.  Mouth/Throat: Oropharynx is clear and moist.  Neck: No thyromegaly present.  Cardiovascular: Normal rate and regular rhythm.   Pulmonary/Chest: Effort normal and breath sounds normal.  Abdominal: Soft. Bowel sounds are normal. There is no tenderness.  Neurological: She is alert and oriented to person, place, and time.  Skin: Skin is warm and dry.  Psychiatric: She has a normal mood and affect.          Assessment & Plan:  Ear Pain:  Located to left ear and jaw line for several days. Exam without evidence for dental or ear involvement. Postnasal drip noted to posterior pharynx.  Suspect due to drainage and smoking. Will have her take zyrtec HS and update me if no improvement.

## 2015-05-20 ENCOUNTER — Other Ambulatory Visit: Payer: Self-pay | Admitting: Primary Care

## 2015-05-20 DIAGNOSIS — A048 Other specified bacterial intestinal infections: Secondary | ICD-10-CM

## 2015-05-20 MED ORDER — OMEPRAZOLE 20 MG PO CPDR: 20.0000 mg | DELAYED_RELEASE_CAPSULE | Freq: Two times a day (BID) | ORAL | Status: DC

## 2015-05-20 MED ORDER — AMOXICILLIN 500 MG PO CAPS: 1000.0000 mg | ORAL_CAPSULE | Freq: Two times a day (BID) | ORAL | Status: DC

## 2015-05-20 MED ORDER — CLARITHROMYCIN 500 MG PO TABS: 500.0000 mg | ORAL_TABLET | Freq: Two times a day (BID) | ORAL | Status: DC

## 2015-05-28 ENCOUNTER — Encounter: Payer: Self-pay | Admitting: Primary Care

## 2015-05-28 ENCOUNTER — Telehealth: Payer: Self-pay | Admitting: Primary Care

## 2015-05-28 NOTE — Telephone Encounter (Signed)
Will you please check on [name]. We started her on treatment for H.Pylori. Any improvement in symptoms?

## 2015-05-28 NOTE — Telephone Encounter (Signed)
Called and spoken to patient's mom. Patient is doing much better now and mom stated that patient will send Anda Kraft a MyChart message with an update later.

## 2015-05-28 NOTE — Telephone Encounter (Signed)
Glad to hear. 

## 2015-06-09 ENCOUNTER — Ambulatory Visit (INDEPENDENT_AMBULATORY_CARE_PROVIDER_SITE_OTHER): Payer: BLUE CROSS/BLUE SHIELD | Admitting: Primary Care

## 2015-06-09 ENCOUNTER — Encounter: Payer: Self-pay | Admitting: Primary Care

## 2015-06-09 VITALS — BP 110/76 | HR 94 | Temp 97.9°F | Ht 64.0 in | Wt 168.0 lb

## 2015-06-09 DIAGNOSIS — R200 Anesthesia of skin: Secondary | ICD-10-CM

## 2015-06-09 DIAGNOSIS — R202 Paresthesia of skin: Principal | ICD-10-CM | POA: Insufficient documentation

## 2015-06-09 HISTORY — DX: Paresthesia of skin: R20.2

## 2015-06-09 HISTORY — DX: Paresthesia of skin: R20.0

## 2015-06-09 MED ORDER — PREDNISONE 10 MG PO TABS: ORAL_TABLET | ORAL | Status: DC

## 2015-06-09 NOTE — Patient Instructions (Signed)
Start Prednisone for inflammation and pain to right hand and arm. Take 3 tablets for 2 days, then 2 tablets for 2 days, then 1 tablet for 2 days.  Wear wrist brace when sleeping and also while at work.  If these symptoms re-occur in the future, try taking ibuprofen 600 mg three times daily as needed and wear wrist brace.   Do not take ibuprofen while taking prednisone.   Please notify me if this occurs frequently.  It was a pleasure to see you today!  Carpal Tunnel Syndrome Carpal tunnel syndrome is a condition that causes pain in your hand and arm. The carpal tunnel is a narrow area located on the palm side of your wrist. Repeated wrist motion or certain diseases may cause swelling within the tunnel. This swelling pinches the main nerve in the wrist (median nerve). CAUSES  This condition may be caused by:   Repeated wrist motions.  Wrist injuries.  Arthritis.  A cyst or tumor in the carpal tunnel.  Fluid buildup during pregnancy. Sometimes the cause of this condition is not known.  RISK FACTORS This condition is more likely to develop in:   People who have jobs that cause them to repeatedly move their wrists in the same motion, such as butchers and cashiers.  Women.  People with certain conditions, such as:  Diabetes.  Obesity.  An underactive thyroid (hypothyroidism).  Kidney failure. SYMPTOMS  Symptoms of this condition include:   A tingling feeling in your fingers, especially in your thumb, index, and middle fingers.  Tingling or numbness in your hand.  An aching feeling in your entire arm, especially when your wrist and elbow are bent for long periods of time.  Wrist pain that goes up your arm to your shoulder.  Pain that goes down into your palm or fingers.  A weak feeling in your hands. You may have trouble grabbing and holding items. Your symptoms may feel worse during the night.  DIAGNOSIS  This condition is diagnosed with a medical history and  physical exam. You may also have tests, including:   An electromyogram (EMG). This test measures electrical signals sent by your nerves into the muscles.  X-rays. TREATMENT  Treatment for this condition includes:  Lifestyle changes. It is important to stop doing or modify the activity that caused your condition.  Physical or occupational therapy.  Medicines for pain and inflammation. This may include medicine that is injected into your wrist.  A wrist splint.  Surgery. HOME CARE INSTRUCTIONS  If You Have a Splint:  Wear it as told by your health care provider. Remove it only as told by your health care provider.  Loosen the splint if your fingers become numb and tingle, or if they turn cold and blue.  Keep the splint clean and dry. General Instructions  Take over-the-counter and prescription medicines only as told by your health care provider.  Rest your wrist from any activity that may be causing your pain. If your condition is work related, talk to your employer about changes that can be made, such as getting a wrist pad to use while typing.  If directed, apply ice to the painful area:  Put ice in a plastic bag.  Place a towel between your skin and the bag.  Leave the ice on for 20 minutes, 2-3 times per day.  Keep all follow-up visits as told by your health care provider. This is important.  Do any exercises as told by your health care provider,  physical therapist, or occupational therapist. [place] IF:   You have new symptoms.  Your pain is not controlled with medicines.  Your symptoms get worse.   This information is not intended to replace advice given to you by your health care provider. Make sure you discuss any questions you have with your health care provider.   Document Released: 04/07/2000 Document Revised: 12/30/2014 Document Reviewed: 08/26/2014 Elsevier Interactive Patient Education [employer].

## 2015-06-09 NOTE — Progress Notes (Signed)
Subjective:    Patient ID: Bonnie Mathews, female    DOB: May 27, 1985, 30 y.o.   MRN: [id number]  HPI  [name] is a 30 year old female who presents today with a chief complaint of numbness/tingling and pain to her right extremity.  Her symptoms are located to the right hand that began this morning around 5 am while at work. Throughout the afternoon she's noticed the sensation moving up her right arm and has become quite bothersome. She describes her discomfort as "burning under the skin/skin on fire".    Denies neck pain, recent injury or trauma. She's not taken any medication for her symptoms. Her pain/symptoms are worse with movement. She has an occupation that requires repetitive movement to her hands and she writes a lot in class. She's never been diagnosed with carpal tunnel in the past, but does remember having to wear an arm brace in the past for similar symptoms.   Review of Systems  Constitutional: Negative for fever and chills.  Respiratory: Negative for shortness of breath.   Cardiovascular: Negative for chest pain.  Musculoskeletal: Negative for neck pain.  Neurological: Positive for numbness.       Numbness/tingling to right upper extremity.        Past Medical History  Diagnosis Date  . Peptic ulcer   . Chickenpox   . GERD (gastroesophageal reflux disease)     Social History   Social History  . Marital Status: Single    Spouse Name: N/A  . Number of Children: N/A  . Years of Education: N/A   Occupational History  . Not on file.   Social History Main Topics  . Smoking status: Current Every Day Smoker -- 1.00 packs/day    Types: Cigarettes  . Smokeless tobacco: Not on file  . Alcohol Use: 1.8 oz/week    3 Cans of beer per week  . Drug Use: No  . Sexual Activity: Not on file   Other Topics Concern  . Not on file   Social History Narrative   Single.   1 child.   Works as a Physiological scientist   Enjoys working out and watching TV.   She's working on her GED.      Past Surgical History  Procedure Laterality Date  . Tonsillectomy and adenoidectomy  1993    Family History  Problem Relation Age of Onset  . Arthritis Mother   . Heart disease Mother   . Diabetes Mother   . Hypertension Father   . Arthritis Maternal Aunt   . Breast cancer Maternal Aunt   . Alcohol abuse Paternal Uncle   . Mental illness Maternal Grandmother   . Mental illness Maternal Grandfather   . Colon cancer Paternal Grandmother   . Mental illness Paternal Grandmother   . Alcohol abuse Paternal Grandfather   . Mental illness Paternal Grandfather     Allergies  Allergen Reactions  . Doxycycline Shortness Of Breath    Current Outpatient Prescriptions on File Prior to Visit  Medication Sig Dispense Refill  . albuterol (PROVENTIL HFA;VENTOLIN HFA) 108 (90 BASE) MCG/ACT inhaler Inhale 2 puffs into the lungs every 6 (six) hours as needed for wheezing or shortness of breath. 1 Inhaler 2  . clarithromycin (BIAXIN) 500 MG tablet Take 1 tablet (500 mg total) by mouth 2 (two) times daily. 20 tablet 0  . omeprazole (PRILOSEC) 20 MG capsule Take 1 capsule (20 mg total) by mouth 2 (two) times daily before a meal. 20 capsule  0   No current facility-administered medications on file prior to visit.    BP 110/76 mmHg  Pulse 94  Temp(Src) 97.9 F (36.6 C) (Oral)  Ht 5\' 4"  (1.626 m)  Wt 168 lb (76.204 kg)  BMI 28.82 kg/m2  SpO2 98%  LMP 06/04/2015    Objective:   Physical Exam  Constitutional: She appears well-nourished.  Neck: Normal range of motion.  Cardiovascular: Normal rate and regular rhythm.   Pulmonary/Chest: Effort normal and breath sounds normal.  Musculoskeletal:  Positive Tinel's sign.   Skin: Skin is warm and dry.  Mild swelling to right hand and forearm. Slight erythema. Moderately tender upon palpation.          Assessment & Plan:

## 2015-06-09 NOTE — Assessment & Plan Note (Signed)
Present since early AM. Has an occupation that requires repetitive movement to hands and wrist. Suspect carpal tunnel syndrome. Positive Tinel's sign. Will treat with prednisone taper, wrist brace, rest. Information provided regarding carpel tunnel. Return precautions provided.

## 2015-06-09 NOTE — Progress Notes (Signed)
Pre visit review using our clinic review tool, if applicable. No additional management support is needed unless otherwise documented below in the visit note. 

## 2015-06-25 ENCOUNTER — Ambulatory Visit (INDEPENDENT_AMBULATORY_CARE_PROVIDER_SITE_OTHER): Payer: BLUE CROSS/BLUE SHIELD | Admitting: Primary Care

## 2015-06-25 ENCOUNTER — Encounter: Payer: Self-pay | Admitting: Primary Care

## 2015-06-25 VITALS — BP 118/76 | HR 97 | Temp 98.1°F | Ht 64.0 in | Wt 172.8 lb

## 2015-06-25 DIAGNOSIS — R05 Cough: Secondary | ICD-10-CM

## 2015-06-25 DIAGNOSIS — R059 Cough, unspecified: Secondary | ICD-10-CM

## 2015-06-25 LAB — POC INFLUENZA A&B (BINAX/QUICKVUE)
Influenza A, POC: NEGATIVE
Influenza B, POC: NEGATIVE

## 2015-06-25 MED ORDER — HYDROCODONE-HOMATROPINE 5-1.5 MG/5ML PO SYRP: 5.0000 mL | ORAL_SOLUTION | Freq: Every evening | ORAL | Status: DC | PRN

## 2015-06-25 MED ORDER — BENZONATATE 200 MG PO CAPS: 200.0000 mg | ORAL_CAPSULE | Freq: Three times a day (TID) | ORAL | Status: DC | PRN

## 2015-06-25 NOTE — Progress Notes (Signed)
Pre visit review using our clinic review tool, if applicable. No additional management support is needed unless otherwise documented below in the visit note. 

## 2015-06-25 NOTE — Progress Notes (Signed)
Subjective:    Patient ID: Bonnie Mathews, female    DOB: [birth year], 30 y.o.   MRN: [id number]  HPI  Bonnie Mathews is a 30 year old female who presents today with a chief complaint of cough. She also reports nasal congestion, sore throat, body aches, chills, headache. Her symptoms have been present since Tuesday this week. Her cough is productive with a small amount of yellow mucous. Denies fevers. She's taken a "multi-symptom pill", Dayquil, Nyquil, and zyrtec without improvement. Her most bothersome complaint today is cough.   Review of Systems  Constitutional: Positive for fatigue. Negative for fever and chills.  HENT: Positive for congestion, rhinorrhea, sinus pressure and sore throat.   Respiratory: Positive for cough.   Cardiovascular: Negative for chest pain.  Musculoskeletal: Negative for myalgias.       Past Medical History  Diagnosis Date  . Peptic ulcer   . Chickenpox   . GERD (gastroesophageal reflux disease)     Social History   Social History  . Marital Status: Single    Spouse Name: N/A  . Number of Children: N/A  . Years of Education: N/A   Occupational History  . Not on file.   Social History Main Topics  . Smoking status: Current Every Day Smoker -- 1.00 packs/day    Types: Cigarettes  . Smokeless tobacco: Not on file  . Alcohol Use: 1.8 oz/week    3 Cans of beer per week  . Drug Use: No  . Sexual Activity: Not on file   Other Topics Concern  . Not on file   Social History Narrative   Single.   1 child.   Works as a Physiological scientist   Enjoys working out and watching TV.   She's working on her GED.     Past Surgical History  Procedure Laterality Date  . Tonsillectomy and adenoidectomy  1993    Family History  Problem Relation Age of Onset  . Arthritis Mother   . Heart disease Mother   . Diabetes Mother   . Hypertension Father   . Arthritis Maternal Aunt   . Breast cancer Maternal Aunt   . Alcohol abuse Paternal Uncle   . Mental illness  Maternal Grandmother   . Mental illness Maternal Grandfather   . Colon cancer Paternal Grandmother   . Mental illness Paternal Grandmother   . Alcohol abuse Paternal Grandfather   . Mental illness Paternal Grandfather     Allergies  Allergen Reactions  . Doxycycline Shortness Of Breath    Current Outpatient Prescriptions on File Prior to Visit  Medication Sig Dispense Refill  . albuterol (PROVENTIL HFA;VENTOLIN HFA) 108 (90 BASE) MCG/ACT inhaler Inhale 2 puffs into the lungs every 6 (six) hours as needed for wheezing or shortness of breath. 1 Inhaler 2  . omeprazole (PRILOSEC) 20 MG capsule Take 1 capsule (20 mg total) by mouth 2 (two) times daily before a meal. 20 capsule 0   No current facility-administered medications on file prior to visit.    BP 118/76 mmHg  Pulse 97  Temp(Src) 98.1 F (36.7 C) (Oral)  Ht 5\' 4"  (1.626 m)  Wt 172 lb 12.8 oz (78.382 kg)  BMI 29.65 kg/m2  SpO2 99%  LMP 06/04/2015    Objective:   Physical Exam  Constitutional: She appears well-nourished.  HENT:  Right Ear: Tympanic membrane and ear canal normal.  Left Ear: Tympanic membrane and ear canal normal.  Nose: Right sinus exhibits maxillary sinus tenderness. Right sinus exhibits  no frontal sinus tenderness. Left sinus exhibits maxillary sinus tenderness. Left sinus exhibits no frontal sinus tenderness.  Mouth/Throat: Oropharynx is clear and moist.  Eyes: Conjunctivae are normal.  Neck: Neck supple.  Cardiovascular: Normal rate and regular rhythm.   Pulmonary/Chest: Effort normal and breath sounds normal. She has no wheezes. She has no rales.  Skin: Skin is warm and dry.          Assessment & Plan:  URI:  Cough, congestion, fatigue, sinus pressure, sore throat x 3 days. Exam with dry cough, persistent. Clear lungs which is reassuring. Mild sinus tenderness to maxillary sinuses.  Rapid flu: Negative Suspect viral involvement at this point and will treat with supportive measures. RX  for Tessalon pearls for day cough, Hycodan HS for night cough. Start Flonase, continue Zyrtec. Return precautions provided.

## 2015-06-25 NOTE — Patient Instructions (Signed)
Nasal Congestion: Try using Flonase (fluticasone) nasal spray. Instill 2 sprays in each nostril once daily.   Cough/Congestion: Try taking Mucinex DM. This will help loosen up the mucous in your chest. Ensure you take this medication with a full glass of water.  You may take Benzonatate capsules for cough. Take 1 capsule by mouth three times daily as needed for cough.  You may take the Hycodan cough suppressant at bedtime as needed for cough and rest. Caution this medication contains codeine and will make you feel drowsy.  Continue taking the Zyrtec at bedtime.  Please notify me if you develop persistent fevers of 101, start coughing up green mucous, notice increased fatigue or weakness, or feel worse after 1 week of onset of symptoms.   Increase consumption of water intake and rest.  It was a pleasure to see you today!  Upper Respiratory Infection, Adult Most upper respiratory infections (URIs) are a viral infection of the air passages leading to the lungs. A URI affects the nose, throat, and upper air passages. The most common type of URI is nasopharyngitis and is typically referred to as "the common cold." URIs run their course and usually go away on their own. Most of the time, a URI does not require medical attention, but sometimes a bacterial infection in the upper airways can follow a viral infection. This is called a secondary infection. Sinus and middle ear infections are common types of secondary upper respiratory infections. Bacterial pneumonia can also complicate a URI. A URI can worsen asthma and chronic obstructive pulmonary disease (COPD). Sometimes, these complications can require emergency medical care and may be life threatening.  CAUSES Almost all URIs are caused by viruses. A virus is a type of germ and can spread from one person to another.  RISKS FACTORS You may be at risk for a URI if:   You smoke.   You have chronic heart or lung disease.  You have a weakened  defense (immune) system.   You are very young or very old.   You have nasal allergies or asthma.  You work in crowded or poorly ventilated areas.  You work in health care facilities or schools. SIGNS AND SYMPTOMS  Symptoms typically develop 2-3 days after you come in contact with a cold virus. Most viral URIs last 7-10 days. However, viral URIs from the influenza virus (flu virus) can last 14-18 days and are typically more severe. Symptoms may include:   Runny or stuffy (congested) nose.   Sneezing.   Cough.   Sore throat.   Headache.   Fatigue.   Fever.   Loss of appetite.   Pain in your forehead, behind your eyes, and over your cheekbones (sinus pain).  Muscle aches.  DIAGNOSIS  Your health care provider may diagnose a URI by:  Physical exam.  Tests to check that your symptoms are not due to another condition such as:  Strep throat.  Sinusitis.  Pneumonia.  Asthma. TREATMENT  A URI goes away on its own with time. It cannot be cured with medicines, but medicines may be prescribed or recommended to relieve symptoms. Medicines may help:  Reduce your fever.  Reduce your cough.  Relieve nasal congestion. HOME CARE INSTRUCTIONS   Take medicines only as directed by your health care provider.   Gargle warm saltwater or take cough drops to comfort your throat as directed by your health care provider.  Use a warm mist humidifier or inhale steam from a shower to increase air  moisture. This may make it easier to breathe.  Drink enough fluid to keep your urine clear or pale yellow.   Eat soups and other clear broths and maintain good nutrition.   Rest as needed.   Return to work when your temperature has returned to normal or as your health care provider advises. You may need to stay home longer to avoid infecting others. You can also use a face mask and careful hand washing to prevent spread of the virus.  Increase the usage of your inhaler if  you have asthma.   Do not use any tobacco products, including cigarettes, chewing tobacco, or electronic cigarettes. If you need help quitting, ask your health care provider. PREVENTION  The best way to protect yourself from getting a cold is to practice good hygiene.   Avoid oral or hand contact with people with cold symptoms.   Wash your hands often if contact occurs.  There is no clear evidence that vitamin C, vitamin E, echinacea, or exercise reduces the chance of developing a cold. However, it is always recommended to get plenty of rest, exercise, and practice good nutrition.  SEEK MEDICAL CARE IF:   You are getting worse rather than better.   Your symptoms are not controlled by medicine.   You have chills.  You have worsening shortness of breath.  You have brown or red mucus.  You have yellow or brown nasal discharge.  You have pain in your face, especially when you bend forward.  You have a fever.  You have swollen neck glands.  You have pain while swallowing.  You have white areas in the back of your throat. SEEK IMMEDIATE MEDICAL CARE IF:   You have severe or persistent:  Headache.  Ear pain.  Sinus pain.  Chest pain.  You have chronic lung disease and any of the following:  Wheezing.  Prolonged cough.  Coughing up blood.  A change in your usual mucus.  You have a stiff neck.  You have changes in your:  Vision.  Hearing.  Thinking.  Mood. MAKE SURE YOU:   Understand these instructions.  Will watch your condition.  Will get help right away if you are not doing well or get worse.   This information is not intended to replace advice given to you by your health care provider. Make sure you discuss any questions you have with your health care provider.   Document Released: 10/04/2000 Document Revised: 08/25/2014 Document Reviewed: 07/16/2013 Elsevier Interactive Patient Education [employer].

## 2015-06-25 NOTE — Addendum Note (Signed)
Addended by: Jacqualin Combes on: 06/25/2015 10:10 AM   Modules accepted: Orders, SmartSet

## 2015-06-30 ENCOUNTER — Encounter: Payer: Self-pay | Admitting: Primary Care

## 2015-07-01 ENCOUNTER — Other Ambulatory Visit: Payer: Self-pay | Admitting: Primary Care

## 2015-07-01 DIAGNOSIS — R05 Cough: Secondary | ICD-10-CM

## 2015-07-01 DIAGNOSIS — R059 Cough, unspecified: Secondary | ICD-10-CM

## 2015-07-01 MED ORDER — AZITHROMYCIN 250 MG PO TABS: ORAL_TABLET | ORAL | Status: DC

## 2015-07-06 ENCOUNTER — Encounter: Payer: Self-pay | Admitting: Primary Care

## 2015-08-03 ENCOUNTER — Other Ambulatory Visit: Payer: Self-pay | Admitting: Primary Care

## 2015-08-03 ENCOUNTER — Encounter: Payer: Self-pay | Admitting: Primary Care

## 2015-09-29 ENCOUNTER — Encounter: Payer: Self-pay | Admitting: Primary Care

## 2015-09-29 ENCOUNTER — Ambulatory Visit (INDEPENDENT_AMBULATORY_CARE_PROVIDER_SITE_OTHER): Payer: BLUE CROSS/BLUE SHIELD | Admitting: Primary Care

## 2015-09-29 ENCOUNTER — Other Ambulatory Visit (HOSPITAL_COMMUNITY)
Admission: RE | Admit: 2015-09-29 | Discharge: 2015-09-29 | Disposition: A | Discharge status: Home / Self Care | Payer: BLUE CROSS/BLUE SHIELD | Source: Ambulatory Visit | Attending: Primary Care | Admitting: Primary Care

## 2015-09-29 VITALS — BP 118/76 | HR 76 | Temp 98.0°F | Ht 64.0 in | Wt 177.4 lb

## 2015-09-29 DIAGNOSIS — Z1151 Encounter for screening for human papillomavirus (HPV): Secondary | ICD-10-CM | POA: Insufficient documentation

## 2015-09-29 DIAGNOSIS — Z0000 Encounter for general adult medical examination without abnormal findings: Secondary | ICD-10-CM | POA: Insufficient documentation

## 2015-09-29 DIAGNOSIS — Z124 Encounter for screening for malignant neoplasm of cervix: Secondary | ICD-10-CM

## 2015-09-29 DIAGNOSIS — Z23 Encounter for immunization: Secondary | ICD-10-CM | POA: Diagnosis not present

## 2015-09-29 DIAGNOSIS — K279 Peptic ulcer, site unspecified, unspecified as acute or chronic, without hemorrhage or perforation: Secondary | ICD-10-CM | POA: Diagnosis not present

## 2015-09-29 DIAGNOSIS — F17200 Nicotine dependence, unspecified, uncomplicated: Secondary | ICD-10-CM

## 2015-09-29 DIAGNOSIS — Z01419 Encounter for gynecological examination (general) (routine) without abnormal findings: Secondary | ICD-10-CM | POA: Insufficient documentation

## 2015-09-29 LAB — LIPID PANEL
CHOL/HDL RATIO: 2
CHOLESTEROL: 173 mg/dL (ref 0–200)
HDL: 74.3 mg/dL (ref >39.00)
LDL CALC: 89 mg/dL (ref 0–99)
NonHDL: 98.64
Triglycerides: 49 mg/dL (ref 0.0–149.0)
VLDL: 9.8 mg/dL (ref 0.0–40.0)

## 2015-09-29 LAB — COMPREHENSIVE METABOLIC PANEL
ALK PHOS: 47 U/L (ref 39–117)
ALT: 22 U/L (ref 0–35)
AST: 18 U/L (ref 0–37)
Albumin: 4.2 g/dL (ref 3.5–5.2)
BUN: 18 mg/dL (ref 6–23)
CO2: 28 meq/L (ref 19–32)
Calcium: 9.3 mg/dL (ref 8.4–10.5)
Chloride: 103 mEq/L (ref 96–112)
Creatinine, Ser: 0.54 mg/dL (ref 0.40–1.20)
GFR: 141.21 mL/min (ref >60.00)
GLUCOSE: 83 mg/dL (ref 70–99)
POTASSIUM: 3.9 meq/L (ref 3.5–5.1)
Sodium: 137 mEq/L (ref 135–145)
Total Bilirubin: 0.4 mg/dL (ref 0.2–1.2)
Total Protein: 7.2 g/dL (ref 6.0–8.3)

## 2015-09-29 LAB — HEMOGLOBIN A1C: Hgb A1c MFr Bld: 5.2 % (ref 4.6–6.5)

## 2015-09-29 LAB — CBC
HEMATOCRIT: 39.7 % (ref 36.0–46.0)
HEMOGLOBIN: 13.4 g/dL (ref 12.0–15.0)
MCHC: 33.8 g/dL (ref 30.0–36.0)
MCV: 92.5 fl (ref 78.0–100.0)
Platelets: 300 10*3/uL (ref 150.0–400.0)
RBC: 4.3 Mil/uL (ref 3.87–5.11)
RDW: 14 % (ref 11.5–15.5)
WBC: 9.5 10*3/uL (ref 4.0–10.5)

## 2015-09-29 LAB — TSH: TSH: 0.65 u[IU]/mL (ref 0.35–4.50)

## 2015-09-29 NOTE — Patient Instructions (Addendum)
Complete lab work prior to leaving today. I will notify you of your results once received.   I will notify you once I receive your Pap results.  You were provided with a tetanus vaccination which will cover you for 10 years.  Start exercising. You should be getting 1 hour of moderate intensity exercise 5 days weekly.  Continue your efforts towards a healthy diet and routine exercise.  I recommend an annual dental exam.   Follow up in 1 year for repeat physical or sooner if needed.  It was a pleasure to see you today!

## 2015-09-29 NOTE — Assessment & Plan Note (Signed)
Td due, provided today. Pap completed today and is pending. Exam unremarkable. Labs pending. Discussed the importance of a healthy diet and regular exercise in order for weight loss and to reduce risk of other medical diseases.  Follow up in 1 year for repeat physical or sooner if needed.

## 2015-09-29 NOTE — Assessment & Plan Note (Signed)
No abdominal pain since recovery from H.Pylori.

## 2015-09-29 NOTE — Addendum Note (Signed)
Addended by: Jacqualin Combes on: 09/29/2015 12:33 PM   Modules accepted: Orders, SmartSet

## 2015-09-29 NOTE — Addendum Note (Signed)
Addended by: Daralene Milch C on: 09/29/2015 02:09 PM   Modules accepted: Miquel Dunn

## 2015-09-29 NOTE — Progress Notes (Signed)
Subjective:    Patient ID: Bonnie Mathews, female    DOB: March 08, 1986, 30 y.o.   MRN: [id number]  HPI  Bonnie Mathews is a 30 year old female who presents today for complete physical.  Immunizations: -Tetanus: Unsure, believes it's been over 10 years.  -Influenza: Did not complete last season.    Diet: She endorses a fair diet. Breakfast: Cereal, banana bread, cottage cheese with fruit Lunch: Chicken (grilled or baked) Dinner: Chicken, vegetables, salad Snacks: Occasionally, chips Desserts: Rarely Beverages: Water, coffee  Exercise: She does exercises a home 5 days weekly for 1 hour daily. Eye exam: Completed in 2017, slight changes in glasses prescription.  Dental exam: Has not completed in years.  Pap Smear: Completed in 2016.    Review of Systems  Constitutional: Negative for unexpected weight change.  HENT: Negative for rhinorrhea.   Respiratory: Negative for cough and shortness of breath.        Currently smokes, does have morning cough  Cardiovascular: Negative for chest pain.  Gastrointestinal: Negative for diarrhea and constipation.  Genitourinary: Negative for difficulty urinating and menstrual problem.  Musculoskeletal: Negative for myalgias and arthralgias.  Skin: Negative for rash.  Allergic/Immunologic: Negative for environmental allergies.  Neurological: Negative for dizziness, numbness and headaches.  Psychiatric/Behavioral:       Denies concerns for anxiety and depression       Past Medical History  Diagnosis Date  . Peptic ulcer   . Chickenpox   . GERD (gastroesophageal reflux disease)      Social History   Social History  . Marital Status: Single    Spouse Name: N/A  . Number of Children: N/A  . Years of Education: N/A   Occupational History  . Not on file.   Social History Main Topics  . Smoking status: Current Every Day Smoker -- 1.00 packs/day    Types: Cigarettes  . Smokeless tobacco: Not on file  . Alcohol Use: 1.8 oz/week    3  Cans of beer per week  . Drug Use: No  . Sexual Activity: Not on file   Other Topics Concern  . Not on file   Social History Narrative   Single.   1 child.   Works as a Physiological scientist   Enjoys working out and watching TV.   She's working on her GED.     Past Surgical History  Procedure Laterality Date  . Tonsillectomy and adenoidectomy  1993    Family History  Problem Relation Age of Onset  . Arthritis Mother   . Heart disease Mother   . Diabetes Mother   . Hypertension Father   . Arthritis Maternal Aunt   . Breast cancer Maternal Aunt   . Alcohol abuse Paternal Uncle   . Mental illness Maternal Grandmother   . Mental illness Maternal Grandfather   . Colon cancer Paternal Grandmother   . Mental illness Paternal Grandmother   . Alcohol abuse Paternal Grandfather   . Mental illness Paternal Grandfather     Allergies  Allergen Reactions  . Doxycycline Shortness Of Breath    Current Outpatient Prescriptions on File Prior to Visit  Medication Sig Dispense Refill  . albuterol (PROVENTIL HFA;VENTOLIN HFA) 108 (90 BASE) MCG/ACT inhaler Inhale 2 puffs into the lungs every 6 (six) hours as needed for wheezing or shortness of breath. 1 Inhaler 2  . omeprazole (PRILOSEC) 20 MG capsule Take 1 capsule (20 mg total) by mouth 2 (two) times daily before a meal. 20 capsule 0  No current facility-administered medications on file prior to visit.    BP 118/76 mmHg  Pulse 76  Temp(Src) 98 F (36.7 C) (Oral)  Ht 5\' 4"  (1.626 m)  Wt 177 lb 6.4 oz (80.468 kg)  BMI 30.44 kg/m2  SpO2 99%  LMP 09/24/2015    Objective:   Physical Exam  Constitutional: She is oriented to person, place, and time. She appears well-nourished.  HENT:  Right Ear: Tympanic membrane and ear canal normal.  Left Ear: Tympanic membrane and ear canal normal.  Nose: Nose normal.  Mouth/Throat: Oropharynx is clear and moist.  Eyes: Conjunctivae and EOM are normal. Pupils are equal, round, and reactive to  light.  Neck: Neck supple. No thyromegaly present.  Cardiovascular: Normal rate and regular rhythm.   No murmur heard. Pulmonary/Chest: Effort normal and breath sounds normal. She has no rales.  Abdominal: Soft. Bowel sounds are normal. There is no tenderness.  Genitourinary: Cervix exhibits no motion tenderness and no discharge. No vaginal discharge found.  Musculoskeletal: Normal range of motion.  Lymphadenopathy:    She has no cervical adenopathy.  Neurological: She is alert and oriented to person, place, and time. She has normal reflexes. No cranial nerve deficit.  Skin: Skin is warm and dry. No rash noted.  Psychiatric: She has a normal mood and affect.          Assessment & Plan:

## 2015-09-29 NOTE — Assessment & Plan Note (Signed)
Continues to smoke, not ready to quit. Discussed long term effects of tobacco abuse.

## 2015-09-29 NOTE — Progress Notes (Signed)
Pre visit review using our clinic review tool, if applicable. No additional management support is needed unless otherwise documented below in the visit note. 

## 2015-09-30 ENCOUNTER — Encounter: Payer: Self-pay | Admitting: Primary Care

## 2015-09-30 ENCOUNTER — Other Ambulatory Visit: Payer: Self-pay | Admitting: Primary Care

## 2015-09-30 DIAGNOSIS — Z789 Other specified health status: Secondary | ICD-10-CM

## 2015-09-30 MED ORDER — TRI-SPRINTEC 0.18/0.215/0.25 MG-35 MCG PO TABS: 1.0000 | ORAL_TABLET | Freq: Every day | ORAL | Status: DC

## 2015-10-01 LAB — CYTOLOGY - PAP

## 2015-10-20 ENCOUNTER — Encounter: Payer: Self-pay | Admitting: Primary Care

## 2015-10-20 MED ORDER — ALBUTEROL SULFATE HFA 108 (90 BASE) MCG/ACT IN AERS: 2.0000 | INHALATION_SPRAY | Freq: Four times a day (QID) | RESPIRATORY_TRACT | Status: DC | PRN

## 2015-11-12 ENCOUNTER — Encounter: Payer: Self-pay | Admitting: Primary Care

## 2015-11-15 ENCOUNTER — Encounter: Payer: Self-pay | Admitting: Primary Care

## 2015-11-15 ENCOUNTER — Ambulatory Visit: Payer: BLUE CROSS/BLUE SHIELD | Admitting: Internal Medicine

## 2015-11-15 ENCOUNTER — Ambulatory Visit (INDEPENDENT_AMBULATORY_CARE_PROVIDER_SITE_OTHER): Payer: BLUE CROSS/BLUE SHIELD | Admitting: Primary Care

## 2015-11-15 VITALS — BP 118/68 | HR 107 | Temp 98.1°F | Ht 64.0 in | Wt 176.8 lb

## 2015-11-15 DIAGNOSIS — K047 Periapical abscess without sinus: Secondary | ICD-10-CM

## 2015-11-15 MED ORDER — AMOXICILLIN 500 MG PO CAPS: 500.0000 mg | ORAL_CAPSULE | Freq: Two times a day (BID) | ORAL | 0 refills | Status: DC

## 2015-11-15 NOTE — Patient Instructions (Signed)
Start amoxicillin antibiotics. Take 1 tablet by mouth twice daily for 10 days.  Continue tylenol or ibuprofen as needed for pain and inflammation.  Please follow up with a dentist as soon as possible as the infection could likely return.  It was a pleasure to see you today!

## 2015-11-15 NOTE — Progress Notes (Signed)
Pre visit review using our clinic review tool, if applicable. No additional management support is needed unless otherwise documented below in the visit note. 

## 2015-11-15 NOTE — Progress Notes (Addendum)
   Subjective:    Patient ID: Bonnie Mathews, female    DOB: Oct 23, 1985, 30 y.o.   MRN: [id number]  HPI  Ms. Leonard is a 30 year old female who presents today with a chief complaint of dental pain. She also reports pain with eating, sore throat, fatigue. Her pain is located to the lower incisor teeth and has been present since Friday last week. She has a history of dental abscesses and complications in the past. She does not have a dentist as she cannot afford, but plans on seeing a dentist for a sliding scale service in 2 weeks. She's taken tylenol and ibuprofen with some improvement in discomfort.   Review of Systems  Constitutional: Positive for fatigue. Negative for chills and fever.  HENT: Positive for dental problem and ear pain. Negative for sinus pressure and sore throat.   Respiratory: Negative for cough.        Objective:   Physical Exam  Constitutional: She appears well-nourished.  HENT:  Right Ear: Tympanic membrane and ear canal normal.  Left Ear: Tympanic membrane and ear canal normal.  Nose: Right sinus exhibits no maxillary sinus tenderness and no frontal sinus tenderness. Left sinus exhibits no maxillary sinus tenderness and no frontal sinus tenderness.  Mouth/Throat: Oropharynx is clear and moist. Dental abscesses present.  Mild to moderate dental abscess to lower incisors. Does not appear toxic. Tender to right submandibular jaw line.  Eyes: Conjunctivae are normal.  Neck: Neck supple.  Cardiovascular: Normal rate and regular rhythm.   Pulmonary/Chest: Effort normal and breath sounds normal. She has no wheezes. She has no rales.  Lymphadenopathy:    She has no cervical adenopathy.  Skin: Skin is warm and dry.          Assessment & Plan:  Dental Abscess:  Located to lower incisor teeth. Increased pain since Friday last week. Obvious infection present which will require treatment. Rx for Amoxicillin 10 day course sent to pharmacy. Continue tylenol and  ibuprofen PRN. She has a dental appointment in 2 weeks. Return precautions provided.  Sheral Flow, NP

## 2015-12-01 ENCOUNTER — Encounter: Payer: Self-pay | Admitting: Primary Care

## 2015-12-24 ENCOUNTER — Ambulatory Visit (INDEPENDENT_AMBULATORY_CARE_PROVIDER_SITE_OTHER): Payer: BLUE CROSS/BLUE SHIELD

## 2015-12-24 DIAGNOSIS — Z23 Encounter for immunization: Secondary | ICD-10-CM | POA: Diagnosis not present

## 2016-01-04 ENCOUNTER — Encounter: Payer: Self-pay | Admitting: Primary Care

## 2016-02-16 ENCOUNTER — Encounter: Payer: Self-pay | Admitting: Primary Care

## 2016-02-23 ENCOUNTER — Ambulatory Visit (INDEPENDENT_AMBULATORY_CARE_PROVIDER_SITE_OTHER): Payer: BLUE CROSS/BLUE SHIELD | Admitting: Family Medicine

## 2016-02-23 ENCOUNTER — Encounter: Payer: Self-pay | Admitting: Family Medicine

## 2016-02-23 VITALS — BP 106/70 | HR 83 | Temp 98.6°F | Ht 64.0 in | Wt 175.8 lb

## 2016-02-23 DIAGNOSIS — M659 Synovitis and tenosynovitis, unspecified: Secondary | ICD-10-CM | POA: Diagnosis not present

## 2016-02-23 DIAGNOSIS — G5621 Lesion of ulnar nerve, right upper limb: Secondary | ICD-10-CM

## 2016-02-23 DIAGNOSIS — M2550 Pain in unspecified joint: Secondary | ICD-10-CM | POA: Diagnosis not present

## 2016-02-23 DIAGNOSIS — M7581 Other shoulder lesions, right shoulder: Secondary | ICD-10-CM | POA: Diagnosis not present

## 2016-02-23 LAB — HIGH SENSITIVITY CRP: CRP HIGH SENSITIVITY: 12.45 mg/L — AB (ref 0.000–5.000)

## 2016-02-23 LAB — SEDIMENTATION RATE: SED RATE: 9 mm/h (ref 0–20)

## 2016-02-23 LAB — URIC ACID: Uric Acid, Serum: 3.6 mg/dL (ref 2.4–7.0)

## 2016-02-23 NOTE — Progress Notes (Signed)
Pre visit review using our clinic review tool, if applicable. No additional management support is needed unless otherwise documented below in the visit note. 

## 2016-02-23 NOTE — Progress Notes (Signed)
Dr. Frederico Hamman T. Gratia Disla, MD, [facility] [place], [address] Phone: [phone] Fax: [phone]  02/23/2016  Patient: Bonnie Mathews, MRN: [id number], DOB: [birth year], 30 y.o.  Primary Physician:  Sheral Flow, NP   Chief Complaint  Patient presents with  . Carpal Tunnel    right-radiates up arm   Subjective:   Bonnie Mathews is a 30 y.o. very pleasant female patient who presents with the following:  Consulting Provider: Allie Bossier, NP Reason: Tingling and pain in the hand and arm  The patient presents with multiple right-sided extremity issues.  Initially, the patient started to have some pain per her description in her hand in an ulnar distribution x 9 months as well as pain in her forearm in an ulnar distribution. She also has some pain in the elbow primarily localized in the ulnar groove. She denies pain at the lateral upper condyle and denies pain at the medial epicondyle.  She has had no trauma or injury.  She has had some persistent tingling in her hands for at least 9 months. She also has some swelling in the true wrist joint as well as in her MCP joints and her PIP and DIP joints, particular on the right side. She has no known history of gout. Her mother does have a history of arthritis, but she is unclear whether or not this is typical osteoarthritis or a rheumatological condition. She has taken ibuprofen without much significant success. She also has used a thumb spica splint without much significant success.  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  She is not having any significant neck pain, nor has she ever had any significant neck pain.  She does have pain, primarily with abduction and has a pain with a painful arc of motion on the right side and some pain with terminal motion with abduction and flexion.  Past Medical History, Surgical History, Social History, Family  History, Problem List, Medications, and Allergies have been reviewed and updated if relevant.  Patient Active Problem List   Diagnosis Date Noted  . Preventative health care 09/29/2015  . Numbness and tingling of right upper extremity 06/09/2015  . ALLERGIC RHINITIS, SEASONAL 03/06/2008  . TOBACCO ABUSE 02/14/2008    Past Medical History:  Diagnosis Date  . Chickenpox   . GERD (gastroesophageal reflux disease)   . Peptic ulcer   . Tobacco abuse     Past Surgical History:  Procedure Laterality Date  . TONSILLECTOMY AND ADENOIDECTOMY  1993    Social History   Social History  . Marital status: Single    Spouse name: N/A  . Number of children: N/A  . Years of education: N/A   Occupational History  . Not on file.   Social History Main Topics  . Smoking status: Current Every Day Smoker    Packs/day: 1.00    Types: Cigarettes  . Smokeless tobacco: Never Used  . Alcohol use 1.8 oz/week    3 Cans of beer per week  . Drug use: No  . Sexual activity: Not on file   Other Topics Concern  . Not on file   Social History Narrative   Single.   1 child.   Works as a Physiological scientist   Enjoys working out and watching TV.   She's working on her GED.     Family History  Problem Relation Age of Onset  . Arthritis  Mother   . Heart disease Mother   . Diabetes Mother   . Hypertension Father   . Arthritis Maternal Aunt   . Breast cancer Maternal Aunt   . Alcohol abuse Paternal Uncle   . Mental illness Maternal Grandmother   . Mental illness Maternal Grandfather   . Colon cancer Paternal Grandmother   . Mental illness Paternal Grandmother   . Alcohol abuse Paternal Grandfather   . Mental illness Paternal Grandfather     Allergies  Allergen Reactions  . Doxycycline Shortness Of Breath    Medication list reviewed and updated in full in [place].  GEN: No fevers, chills. Nontoxic. Primarily MSK c/o today. MSK: Detailed in the HPI GI: tolerating PO intake without  difficulty Neuro: Tingling and abnormal sensation as described above Otherwise, the pertinent positives and negatives are listed above and in the HPI, otherwise a full review of systems has been reviewed and is negative unless noted positive.   Objective:   BP 106/70   Pulse 83   Temp 98.6 F (37 C) (Oral)   Ht 5\' 4"  (1.626 m)   Wt 175 lb 12 oz (79.7 kg)   LMP 02/11/2016   BMI 30.17 kg/m   GEN: WDWN, NAD, Non-toxic, A & O x 3 HEENT: Atraumatic, Normocephalic. Neck supple. No masses, No LAD. Ears and Nose: No external deformity. CV: RRR, No M/G/R. No JVD. No thrill. No extra heart sounds. PULM: CTA B, no wheezes, crackles, rhonchi. No retractions. No resp. distress. No accessory muscle use. ABD: S, NT, ND, +BS. No rebound. No HSM. EXTR: No c/c/e NEURO Normal gait.  PSYCH: Normally interactive. Conversant. Not depressed or anxious appearing.  Calm demeanor.    Shoulder: R Inspection: No muscle wasting or winging Ecchymosis/edema: neg  AC joint, scapula, clavicle: NT Cervical spine: NT, full ROM Spurling's: neg Abduction: full, 5/5 Flexion: full, 5/5 IR, full, lift-off: 5/5 ER at neutral: full, 5/5 AC crossover: neg Neer: pos Hawkins: pos Drop Test: neg Empty Can: pos Supraspinatus insertion: mild-mod T Bicipital groove: NT Speed's: POS Yergason's: POS Sulcus sign: neg Scapular dyskinesis: none C5-T1 intact  R elbow Ecchymosis or edema: neg ROM: full flexion, extension, pronation, supination Shoulder ROM: Full Flexion: 5/5 Extension: 5/5 Supination: 5/5  Pronation: 5/5 Wrist ext: 5/5 Wrist flexion: 5/5 No gross bony abnormality Varus and Valgus stress: stable ECRB tenderness: neg Medial epicondyle: NT Lateral epicondyle, resisted wrist extension from wrist full pronation and flexion: NT grip: 4/5  Sensation decreased in an ulnar distribution from the elbow to digits 3 4 and 5. Tinel's, Elbow: NOTABLY POSITIVE  At the wrist there is a notable effusion  compared to the right side in the true wrist itself. There is also some restriction in extension and flexion. There is less of a limitation in ulnar and radial deviation.  Notable synovitis on MCP joints 2 through 4 2 into a lesser degree #5. These are all tender to palpate. There is also some cellulitis present on the PIP joints, notably 2 through 5. This is also present to a lesser degree on digits DIP joints 3 4 and 5.  The patient is able to make a composite fist. She has difficulty touching digit 1 and 5 together.  Grip strength is notably poor compared to the other side.  She also does appear to have some forearm musculature atrophy compared to the left side. This is more apparent on the extensor surface.  Radiology: No results found.  Assessment and Plan:  Polyarthralgia - Plan: High sensitivity CRP, Sedimentation rate, Cyclic citrul peptide antibody, IgG, ANA, Rheumatoid factor, Uric acid, Anti-DNA antibody, double-stranded  Synovitis of wrist - Plan: High sensitivity CRP, Sedimentation rate, Cyclic citrul peptide antibody, IgG, ANA, Rheumatoid factor, Uric acid, Anti-DNA antibody, double-stranded  Synovitis and tenosynovitis - Plan: High sensitivity CRP, Sedimentation rate, Cyclic citrul peptide antibody, IgG, ANA, Rheumatoid factor, Uric acid, Anti-DNA antibody, double-stranded  Cubital tunnel syndrome on right  Rotator cuff tendonitis, right  Level of Medical Decision-Making in this case is high.  Challenging case with a number of concerning issues.  The patient has got some significant swelling at age 67 with cellulitis in multiple MCP joints, PIP joints, and DIP joints as well as the true wrist on the right concerning for underlying rheumatological pathology. We will initiate basic workup here. Depending on results, I may involve rheumatology and discuss further with the patient.  For now hold any sort of systemic steroids or injectable steroids given potential  rheumatological condition.  Cubital tunnel syndrome or ulnar neuropathy is an entrapment neuropathy of the ulnar nerve at the elbow. This often occurs from repetitive trauma to this area from resting the elbow during occupational situations. Sometimes sleeping in flexion at the elbow can induce this as well. I reviewed the relevant anatomy and gave the patient a handout on this condition, which reviews recommendations, basic nerve gliding and conservative care.   I also recommended either getting a ulnar neuropathy brace for nighttime or to wrap the elbow with towels and then to use tape to keep it in relative extension.  Recommended for daytime use to use a pad or an elbow pad, such as those used by collegiate wrestlers.    She also has classic rotator cuff tendinopathy on the right shoulder, with likely some involvements of the biceps tendon and subacromial bursa as well. For now, we are again await on rheumatological workup, but she is amenable to physical therapy, which I think would be the most reasonable first step in this case. Likely secondary to other issues.  I appreciate the opportunity to evaluate this very friendly patient. If you have any question regarding her care or prognosis, do not hesitate to ask.   Cc: Allie Bossier, NP  Follow-up: depending on additional work-up  Orders Placed This Encounter  Procedures  . High sensitivity CRP  . Sedimentation rate  . Cyclic citrul peptide antibody, IgG  . ANA  . Rheumatoid factor  . Uric acid  . Anti-DNA antibody, double-stranded    Signed,  Jniyah Dantuono T. Pamela Maddy, MD   Patient's Medications  New Prescriptions   No medications on file  Previous Medications   ALBUTEROL (PROVENTIL HFA;VENTOLIN HFA) 108 (90 BASE) MCG/ACT INHALER    Inhale 2 puffs into the lungs every 6 (six) hours as needed for wheezing or shortness of breath.   OMEPRAZOLE (PRILOSEC) 20 MG CAPSULE    Take 1 capsule (20 mg total) by mouth 2 (two) times daily before a  meal.   TRI-SPRINTEC 0.18/0.215/0.25 MG-35 MCG TABLET    Take 1 tablet by mouth daily.  Modified Medications   No medications on file  Discontinued Medications   AMOXICILLIN (AMOXIL) 500 MG CAPSULE    Take 1 capsule (500 mg total) by mouth 2 (two) times daily.

## 2016-02-24 LAB — ANTI-DNA ANTIBODY, DOUBLE-STRANDED

## 2016-02-24 LAB — ANA: Anti Nuclear Antibody(ANA): NEGATIVE

## 2016-02-24 LAB — RHEUMATOID FACTOR

## 2016-02-24 LAB — CYCLIC CITRUL PEPTIDE ANTIBODY, IGG

## 2016-02-29 ENCOUNTER — Other Ambulatory Visit: Payer: Self-pay | Admitting: Family Medicine

## 2016-02-29 ENCOUNTER — Encounter: Payer: Self-pay | Admitting: Family Medicine

## 2016-02-29 DIAGNOSIS — G8929 Other chronic pain: Secondary | ICD-10-CM

## 2016-02-29 DIAGNOSIS — M25521 Pain in right elbow: Secondary | ICD-10-CM

## 2016-02-29 DIAGNOSIS — G5621 Lesion of ulnar nerve, right upper limb: Secondary | ICD-10-CM

## 2016-02-29 DIAGNOSIS — M7581 Other shoulder lesions, right shoulder: Secondary | ICD-10-CM

## 2016-03-01 ENCOUNTER — Encounter: Payer: Self-pay | Admitting: Family Medicine

## 2016-03-01 ENCOUNTER — Ambulatory Visit (INDEPENDENT_AMBULATORY_CARE_PROVIDER_SITE_OTHER): Payer: BLUE CROSS/BLUE SHIELD | Admitting: Family Medicine

## 2016-03-01 VITALS — BP 102/64 | HR 80 | Temp 98.7°F | Ht 64.0 in | Wt 177.2 lb

## 2016-03-01 DIAGNOSIS — G5621 Lesion of ulnar nerve, right upper limb: Secondary | ICD-10-CM

## 2016-03-01 DIAGNOSIS — M7541 Impingement syndrome of right shoulder: Secondary | ICD-10-CM | POA: Diagnosis not present

## 2016-03-01 DIAGNOSIS — M67331 Transient synovitis, right wrist: Secondary | ICD-10-CM | POA: Diagnosis not present

## 2016-03-01 MED ORDER — DEXAMETHASONE SODIUM PHOSPHATE 10 MG/ML IJ SOLN
10.0000 mg | Freq: Once | INTRAMUSCULAR | Status: AC
  Administered 2016-03-01: 10 mg via INTRAMUSCULAR

## 2016-03-01 MED ORDER — METHYLPREDNISOLONE ACETATE 40 MG/ML IJ SUSP
80.0000 mg | Freq: Once | INTRAMUSCULAR | Status: AC
  Administered 2016-03-01: 80 mg via INTRA_ARTICULAR

## 2016-03-01 NOTE — Patient Instructions (Signed)

## 2016-03-01 NOTE — Progress Notes (Signed)
Pre visit review using our clinic review tool, if applicable. No additional management support is needed unless otherwise documented below in the visit note. 

## 2016-03-01 NOTE — Progress Notes (Signed)
Dr. Frederico Hamman T. Xyla Leisner, MD, [facility] Medicine [place], [address] Phone: [phone] Fax: [phone]  03/01/2016  Patient: Bonnie Mathews, MRN: [id number], DOB: 1985/10/03, 30 y.o.  Primary Physician:  Sheral Flow, NP   Chief Complaint  Patient presents with  . Follow-up    Carpal Tunnel   Subjective:   Bonnie Mathews is a 30 y.o. very pleasant female patient who presents with the following:  R shoulder and synovitis wrist and fingers.  Other this moderately elevated CRP, the patient's workup including sedimentation rate were all normal.  She continues to have swelling fairly diffusely in her MCPs on the right as well as in the DIP and PIP joints.  No trauma.   She also continues to have  What appears to be cubital tunnel syndrome on the right.  She has been trying to sleep with her arm in extension some to see if this helps.  Additionally, she continues to have persistent  Aching in pain with abduction and internal range of motion in right shoulder.  She has been guarding this somewhat.  02/23/2016 Last OV with Owens Loffler, MD  Consulting Provider: Allie Bossier, NP Reason: Tingling and pain in the hand and arm  The patient presents with multiple right-sided extremity issues.  Initially, the patient started to have some pain per her description in her hand in an ulnar distribution x 9 months as well as pain in her forearm in an ulnar distribution. She also has some pain in the elbow primarily localized in the ulnar groove. She denies pain at the lateral upper condyle and denies pain at the medial epicondyle.  She has had no trauma or injury.  She has had some persistent tingling in her hands for at least 9 months. She also has some swelling in the true wrist joint as well as in her MCP joints and her PIP and DIP joints, particular on the right side. She has no known history of gout. Her mother does have a  history of arthritis, but she is unclear whether or not this is typical osteoarthritis or a rheumatological condition. She has taken ibuprofen without much significant success. She also has used a thumb spica splint without much significant success.  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  She is not having any significant neck pain, nor has she ever had any significant neck pain.  She does have pain, primarily with abduction and has a pain with a painful arc of motion on the right side and some pain with terminal motion with abduction and flexion.  Past Medical History, Surgical History, Social History, Family History, Problem List, Medications, and Allergies have been reviewed and updated if relevant.  Patient Active Problem List   Diagnosis Date Noted  . Preventative health care 09/29/2015  . Numbness and tingling of right upper extremity 06/09/2015  . ALLERGIC RHINITIS, SEASONAL 03/06/2008  . TOBACCO ABUSE 02/14/2008    Past Medical History:  Diagnosis Date  . Chickenpox   . GERD (gastroesophageal reflux disease)   . Peptic ulcer   . Tobacco abuse     Past Surgical History:  Procedure Laterality Date  . TONSILLECTOMY AND ADENOIDECTOMY  1993    Social History   Social History  . Marital status: Single    Spouse name: N/A  . Number of children: N/A  . Years of education: N/A   Occupational History  .  Not on file.   Social History Main Topics  . Smoking status: Current Every Day Smoker    Packs/day: 1.00    Types: Cigarettes  . Smokeless tobacco: Never Used  . Alcohol use 1.8 oz/week    3 Cans of beer per week  . Drug use: No  . Sexual activity: Not on file   Other Topics Concern  . Not on file   Social History Narrative   Single.   1 child.   Works as a Physiological scientist   Enjoys working out and watching TV.   She's working on her GED.     Family History  Problem Relation Age of Onset  . Arthritis Mother   . Heart disease Mother     . Diabetes Mother   . Hypertension Father   . Arthritis Maternal Aunt   . Breast cancer Maternal Aunt   . Alcohol abuse Paternal Uncle   . Mental illness Maternal Grandmother   . Mental illness Maternal Grandfather   . Colon cancer Paternal Grandmother   . Mental illness Paternal Grandmother   . Alcohol abuse Paternal Grandfather   . Mental illness Paternal Grandfather     Allergies  Allergen Reactions  . Doxycycline Shortness Of Breath    Medication list reviewed and updated in full in [place].  GEN: No fevers, chills. Nontoxic. Primarily MSK c/o today. MSK: Detailed in the HPI GI: tolerating PO intake without difficulty Neuro: Tingling and abnormal sensation as described above Otherwise, the pertinent positives and negatives are listed above and in the HPI, otherwise a full review of systems has been reviewed and is negative unless noted positive.   Objective:   BP 102/64   Pulse 80   Temp 98.7 F (37.1 C) (Oral)   Ht 5\' 4"  (1.626 m)   Wt 177 lb 4 oz (80.4 kg)   LMP 02/11/2016   BMI 30.42 kg/m   GEN: WDWN, NAD, Non-toxic, A & O x 3 HEENT: Atraumatic, Normocephalic. Neck supple. No masses, No LAD. Ears and Nose: No external deformity. CV: RRR, No M/G/R. No JVD. No thrill. No extra heart sounds. PULM: CTA B, no wheezes, crackles, rhonchi. No retractions. No resp. distress. No accessory muscle use. ABD: S, NT, ND, +BS. No rebound. No HSM. EXTR: No c/c/e NEURO Normal gait.  PSYCH: Normally interactive. Conversant. Not depressed or anxious appearing.  Calm demeanor.    Shoulder: R Inspection: No muscle wasting or winging Ecchymosis/edema: neg  AC joint, scapula, clavicle: NT Cervical spine: NT, full ROM Spurling's: neg Abduction: full, 5/5 Flexion: full, 5/5 IR, full, lift-off: 5/5 ER at neutral: full, 5/5 AC crossover: neg Neer: pos Hawkins: pos Drop Test: neg Empty Can: pos Supraspinatus insertion: mild-mod T Bicipital groove: NT Speed's:  POS Yergason's: POS Sulcus sign: neg Scapular dyskinesis: none C5-T1 intact  R elbow Ecchymosis or edema: neg ROM: full flexion, extension, pronation, supination Shoulder ROM: Full Flexion: 5/5 Extension: 5/5 Supination: 5/5  Pronation: 5/5 Wrist ext: 5/5 Wrist flexion: 5/5 No gross bony abnormality Varus and Valgus stress: stable ECRB tenderness: neg Medial epicondyle: NT Lateral epicondyle, resisted wrist extension from wrist full pronation and flexion: NT grip: 4/5  Sensation decreased in an ulnar distribution from the elbow to digits 3 4 and 5. Tinel's, Elbow: NOTABLY POSITIVE  At the wrist there is a notable effusion compared to the right side in the true wrist itself. There is also some restriction in extension and flexion. There is less of a limitation in  ulnar and radial deviation.  Notable synovitis on MCP joints 2 through 4 2 into a lesser degree #5. These are all tender to palpate. There is also some cellulitis present on the PIP joints, notably 2 through 5. This is also present to a lesser degree on digits DIP joints 3 4 and 5.  The patient is able to make a composite fist. She has difficulty touching digit 1 and 5 together.  Grip strength is notably poor compared to the other side.  She also does appear to have some forearm musculature atrophy compared to the left side. This is more apparent on the extensor surface.  Radiology: Results for orders placed or performed in visit on 02/23/16  High sensitivity CRP  Result Value Ref Range   CRP, High Sensitivity 12.450 (H) 0.000 - 5.000 mg/L  Sedimentation rate  Result Value Ref Range   Sed Rate 9 0 - 20 mm/hr  Cyclic citrul peptide antibody, IgG  Result Value Ref Range   Cyclic Citrullin Peptide Ab <16 Units  ANA  Result Value Ref Range   Anit Nuclear Antibody(ANA) NEG NEGATIVE  Rheumatoid factor  Result Value Ref Range   Rhuematoid fact SerPl-aCnc <14 <14 IU/mL  Uric acid  Result Value Ref Range   Uric  Acid, Serum 3.6 2.4 - 7.0 mg/dL  Anti-DNA antibody, double-stranded  Result Value Ref Range   ds DNA Ab <1 IU/mL     Assessment and Plan:   Impingement syndrome of right shoulder - Plan: Ambulatory referral to Physical Therapy, methylPREDNISolone acetate (DEPO-MEDROL) injection 80 mg  Transient synovitis, right wrist  Cubital tunnel syndrome, right - Plan: Ambulatory referral to Physical Therapy, dexamethasone (DECADRON) injection 10 mg  History of ulcer with gastritis per her report, and we will hold off on any oral steroids or NSAIDs.  I am Decadron 10 mg.  Significant impingement.  Formal physical therapy, home rehabilitation reviewed, and combined intra-articular and subacromial injection.  Intrarticular Shoulder Injection, R Verbal consent was obtained from the patient. Risks including infection explained and contrasted with benefits and alternatives. Patient prepped with Chloraprep and Ethyl Chloride used for anesthesia. An intraarticular shoulder injection was performed using the posterior approach. The patient tolerated the procedure well and had decreased pain post injection. No complications. Injection: 4 cc of Lidocaine 1% and 1 mL Depo-Medrol 40 mg. Needle: 22 gauge   SubAC Injection, R Verbal consent was obtained from the patient. Risks (including rare infection), benefits, and alternatives were explained. Patient prepped with Chloraprep and Ethyl Chloride used for anesthesia. The subacromial space was injected using the posterior approach. The patient tolerated the procedure well and had decreased pain post injection. No complications. Injection: 4 cc of Lidocaine 1% and 1 mL of Depo-Medrol 40 mg. Needle: 22 gauge     Follow-up: 6 weeks  Meds ordered this encounter  Medications  . dexamethasone (DECADRON) injection 10 mg  . methylPREDNISolone acetate (DEPO-MEDROL) injection 80 mg   Orders Placed This Encounter  Procedures  . Ambulatory referral to Physical  Therapy    Signed,  Frederico Hamman T. Sydnie Sigmund, MD     Medication List       Accurate as of 03/01/16 11:59 PM. Always use your most recent med list.          albuterol 108 (90 Base) MCG/ACT inhaler Commonly known as:  PROVENTIL HFA;VENTOLIN HFA Inhale 2 puffs into the lungs every 6 (six) hours as needed for wheezing or shortness of breath.   omeprazole 20 MG capsule Commonly  known as:  PRILOSEC Take 1 capsule (20 mg total) by mouth 2 (two) times daily before a meal.   TRI-SPRINTEC 0.18/0.215/0.25 MG-35 MCG tablet Generic drug:  Norgestimate-Ethinyl Estradiol Triphasic Take 1 tablet by mouth daily.

## 2016-03-06 ENCOUNTER — Encounter: Payer: Self-pay | Admitting: Family Medicine

## 2016-03-06 MED ORDER — CEPHALEXIN 500 MG PO CAPS: 1000.0000 mg | ORAL_CAPSULE | Freq: Two times a day (BID) | ORAL | 0 refills | Status: DC

## 2016-03-06 NOTE — Addendum Note (Signed)
Addended by: Owens Loffler on: 03/06/2016 11:05 AM   Modules accepted: Orders

## 2016-03-06 NOTE — Telephone Encounter (Signed)
Spoke with patient.  She states she is at work but they are trying to find a good time patient can come to office.  She states she will be here at soon at she can.

## 2016-03-06 NOTE — Telephone Encounter (Signed)
Is this on the left or right. I want to see her face to face ASAP this morning to examine and assess.

## 2016-04-04 ENCOUNTER — Encounter: Payer: Self-pay | Admitting: Family Medicine

## 2016-04-05 MED ORDER — TRAMADOL HCL 50 MG PO TABS: 50.0000 mg | ORAL_TABLET | Freq: Four times a day (QID) | ORAL | 1 refills | Status: DC | PRN

## 2016-04-05 NOTE — Telephone Encounter (Signed)
Try this first along with tylenol  Tramadol 50 mg, 1 po q 6 hours prn pain, #50, 1 refills   Electronically Signed  By: Owens Loffler, MD On: 04/05/2016 8:15 AM

## 2016-04-05 NOTE — Telephone Encounter (Signed)
Tramadol called into [facility] [address] [place]. 

## 2016-04-26 ENCOUNTER — Encounter: Payer: Self-pay | Admitting: Family Medicine

## 2016-04-26 ENCOUNTER — Ambulatory Visit (INDEPENDENT_AMBULATORY_CARE_PROVIDER_SITE_OTHER): Payer: BLUE CROSS/BLUE SHIELD | Admitting: Family Medicine

## 2016-04-26 VITALS — BP 100/70 | HR 86 | Temp 98.3°F | Ht 64.0 in | Wt 179.8 lb

## 2016-04-26 DIAGNOSIS — H6592 Unspecified nonsuppurative otitis media, left ear: Secondary | ICD-10-CM | POA: Diagnosis not present

## 2016-04-26 MED ORDER — AMOXICILLIN 500 MG PO CAPS: 1000.0000 mg | ORAL_CAPSULE | Freq: Two times a day (BID) | ORAL | 0 refills | Status: AC

## 2016-04-26 NOTE — Progress Notes (Signed)
Pre visit review using our clinic review tool, if applicable. No additional management support is needed unless otherwise documented below in the visit note. 

## 2016-04-26 NOTE — Patient Instructions (Signed)
Can try some OTC Sudafed and Afrin nasal spray

## 2016-04-26 NOTE — Progress Notes (Signed)
Dr. Frederico Hamman T. Logan Baltimore, MD, [facility] [place], [address] Phone: [phone] Fax: [phone]  04/26/2016  Patient: Bonnie Mathews, MRN: [id number], DOB: [birth year], 31 y.o.  Primary Physician:  Sheral Flow, NP   Chief Complaint  Patient presents with  . Ear Pain    Left  . Cough  . Nasal Congestion   Subjective:   Bonnie Mathews is a 31 y.o. very pleasant female patient who presents with the following:  Last Tuesday started to get sick and fever of 103 and slept. Since then, a lot of coughing and sleeping. Tylenol, delsym, cough drops. Now the left ear started to hurt a lot.   LOM - now pain in the l ear  Past Medical History, Surgical History, Social History, Family History, Problem List, Medications, and Allergies have been reviewed and updated if relevant.  Patient Active Problem List   Diagnosis Date Noted  . Preventative health care 09/29/2015  . Numbness and tingling of right upper extremity 06/09/2015  . ALLERGIC RHINITIS, SEASONAL 03/06/2008  . TOBACCO ABUSE 02/14/2008    Past Medical History:  Diagnosis Date  . Chickenpox   . GERD (gastroesophageal reflux disease)   . Peptic ulcer   . Tobacco abuse     Past Surgical History:  Procedure Laterality Date  . TONSILLECTOMY AND ADENOIDECTOMY  1993    Social History   Social History  . Marital status: Single    Spouse name: N/A  . Number of children: N/A  . Years of education: N/A   Occupational History  . Not on file.   Social History Main Topics  . Smoking status: Current Every Day Smoker    Packs/day: 1.00    Types: Cigarettes  . Smokeless tobacco: Never Used  . Alcohol use 1.8 oz/week    3 Cans of beer per week  . Drug use: No  . Sexual activity: Not on file   Other Topics Concern  . Not on file   Social History Narrative   Single.   1 child.   Works as a Physiological scientist   Enjoys working out and watching TV.   She's working on her GED.     Family History  Problem Relation Age of Onset  . Arthritis Mother   . Heart disease Mother   . Diabetes Mother   . Hypertension Father   . Arthritis Maternal Aunt   . Breast cancer Maternal Aunt   . Alcohol abuse Paternal Uncle   . Mental illness Maternal Grandmother   . Mental illness Maternal Grandfather   . Colon cancer Paternal Grandmother   . Mental illness Paternal Grandmother   . Alcohol abuse Paternal Grandfather   . Mental illness Paternal Grandfather     Allergies  Allergen Reactions  . Doxycycline Shortness Of Breath    Medication list reviewed and updated in full in [facility].  ROS: GEN: Acute illness details above GI: Tolerating PO intake GU: maintaining adequate hydration and urination Pulm: No SOB Interactive and getting along well at home.  Otherwise, ROS is as per the HPI.  Objective:   BP 100/70   Pulse 86   Temp 98.3 F (36.8 C) (Oral)   Ht 5\' 4"  (1.626 m)   Wt 179 lb 12 oz (81.5 kg)   LMP 03/25/2016   BMI 30.85 kg/m    Gen: WDWN, NAD; A & O x3, cooperative. Pleasant.Globally Non-toxic HEENT: Normocephalic and atraumatic. Throat  clear, w/o exudate, R TM clear, L TM - no landmarks and bulging. rhinnorhea.  MMM Frontal sinuses: NT Max sinuses: NT NECK: Anterior cervical  LAD is absent CV: RRR, No M/G/R, cap refill <2 sec PULM: Breathing comfortably in no respiratory distress. no wheezing, crackles, rhonchi EXT: No c/c/e PSYCH: Friendly, good eye contact MSK: Nml gait     Laboratory and Imaging Data:  Assessment and Plan:   Left otitis media with effusion  Likely flu prior to OM sx  Follow-up: No Follow-up on file.  Meds ordered this encounter  Medications  . amoxicillin (AMOXIL) 500 MG capsule    Sig: Take 2 capsules (1,000 mg total) by mouth 2 (two) times daily.    Dispense:  40 capsule    Refill:  0   Medications Discontinued During This Encounter  Medication Reason  . cephALEXin  (KEFLEX) 500 MG capsule Completed Course   No orders of the defined types were placed in this encounter.   Signed,  Maud Deed. Dhwani Venkatesh, MD   Allergies as of 04/26/2016      Reactions   Doxycycline Shortness Of Breath      Medication List       Accurate as of 04/26/16 11:59 PM. Always use your most recent med list.          albuterol 108 (90 Base) MCG/ACT inhaler Commonly known as:  PROVENTIL HFA;VENTOLIN HFA Inhale 2 puffs into the lungs every 6 (six) hours as needed for wheezing or shortness of breath.   amoxicillin 500 MG capsule Commonly known as:  AMOXIL Take 2 capsules (1,000 mg total) by mouth 2 (two) times daily.   omeprazole 20 MG capsule Commonly known as:  PRILOSEC Take 1 capsule (20 mg total) by mouth 2 (two) times daily before a meal.   traMADol 50 MG tablet Commonly known as:  ULTRAM Take 1 tablet (50 mg total) by mouth every 6 (six) hours as needed.   TRI-SPRINTEC 0.18/0.215/0.25 MG-35 MCG tablet Generic drug:  Norgestimate-Ethinyl Estradiol Triphasic Take 1 tablet by mouth daily.

## 2016-05-05 ENCOUNTER — Encounter: Payer: Self-pay | Admitting: Primary Care

## 2016-06-12 ENCOUNTER — Ambulatory Visit (INDEPENDENT_AMBULATORY_CARE_PROVIDER_SITE_OTHER): Payer: BLUE CROSS/BLUE SHIELD | Admitting: Family Medicine

## 2016-06-12 ENCOUNTER — Ambulatory Visit (INDEPENDENT_AMBULATORY_CARE_PROVIDER_SITE_OTHER)
Admission: RE | Admit: 2016-06-12 | Discharge: 2016-06-12 | Disposition: A | Discharge status: Home / Self Care | Payer: BLUE CROSS/BLUE SHIELD | Source: Ambulatory Visit | Attending: Family Medicine | Admitting: Family Medicine

## 2016-06-12 ENCOUNTER — Encounter: Payer: Self-pay | Admitting: Family Medicine

## 2016-06-12 VITALS — BP 104/60 | HR 93 | Temp 98.5°F | Ht 64.0 in | Wt 182.5 lb

## 2016-06-12 DIAGNOSIS — M25511 Pain in right shoulder: Secondary | ICD-10-CM

## 2016-06-12 DIAGNOSIS — M5410 Radiculopathy, site unspecified: Secondary | ICD-10-CM

## 2016-06-12 MED ORDER — AMITRIPTYLINE HCL 25 MG PO TABS: 25.0000 mg | ORAL_TABLET | Freq: Every day | ORAL | 3 refills | Status: DC

## 2016-06-12 NOTE — Progress Notes (Signed)
Pre visit review using our clinic review tool, if applicable. No additional management support is needed unless otherwise documented below in the visit note. 

## 2016-06-12 NOTE — Progress Notes (Signed)
Dr. Frederico Hamman T. Jamye Balicki, MD, [facility] [place], [address] Phone: [phone] Fax: [phone]  06/12/2016  Patient: Bonnie Mathews, MRN: [id number], DOB: 06-21-1985, 31 y.o.  Primary Physician:  Sheral Flow, NP   Chief Complaint  Patient presents with  . Follow-up    Carpal Tunnel   Subjective:   Bonnie Mathews is a 31 y.o. very pleasant female patient who presents with the following:  F/u cubital tunnel on the R:  Hand hurting - hurts to touch.  Shoulder - posterior and anterior.   Pleasant woman seen previously, see my previous notes for in depth past history.  She has had some radicular versus neuropathic sensations in her hand and arm with an origin at either the ulnar nerve entrapment versus cervical spine.  This persists.  Last time I saw her, she was having some significant impingement and I did a subacromial injection and had her go to physical therapy.  Now she continues to have pain in the shoulder blade region and in the periscapular region and continues to have radicular pain down her right arm.  This is been limiting to her some at work and into a normal every day lifestyle.  Past Medical History, Surgical History, Social History, Family History, Problem List, Medications, and Allergies have been reviewed and updated if relevant.  Patient Active Problem List   Diagnosis Date Noted  . Preventative health care 09/29/2015  . Numbness and tingling of right upper extremity 06/09/2015  . ALLERGIC RHINITIS, SEASONAL 03/06/2008  . TOBACCO ABUSE 02/14/2008    Past Medical History:  Diagnosis Date  . Chickenpox   . GERD (gastroesophageal reflux disease)   . Peptic ulcer   . Tobacco abuse     Past Surgical History:  Procedure Laterality Date  . TONSILLECTOMY AND ADENOIDECTOMY  1993    Social History   Social History  . Marital status: Single    Spouse name: N/A  . Number of  children: N/A  . Years of education: N/A   Occupational History  . Not on file.   Social History Main Topics  . Smoking status: Current Every Day Smoker    Packs/day: 1.00    Types: Cigarettes  . Smokeless tobacco: Never Used  . Alcohol use 1.8 oz/week    3 Cans of beer per week  . Drug use: No  . Sexual activity: Not on file   Other Topics Concern  . Not on file   Social History Narrative   Single.   1 child.   Works as a Physiological scientist   Enjoys working out and watching TV.   She's working on her GED.     Family History  Problem Relation Age of Onset  . Arthritis Mother   . Heart disease Mother   . Diabetes Mother   . Hypertension Father   . Arthritis Maternal Aunt   . Breast cancer Maternal Aunt   . Alcohol abuse Paternal Uncle   . Mental illness Maternal Grandmother   . Mental illness Maternal Grandfather   . Colon cancer Paternal Grandmother   . Mental illness Paternal Grandmother   . Alcohol abuse Paternal Grandfather   . Mental illness Paternal Grandfather     Allergies  Allergen Reactions  . Doxycycline Shortness Of Breath    Medication list reviewed and updated in full in [facility].  GEN: No fevers, chills. Nontoxic. Primarily MSK c/o today. MSK: Detailed in  the HPI GI: tolerating PO intake without difficulty Neuro: No numbness, parasthesias, or tingling associated. Otherwise the pertinent positives of the ROS are noted above.   Objective:   BP 104/60   Pulse 93   Temp 98.5 F (36.9 C) (Oral)   Ht 5\' 4"  (1.626 m)   Wt 182 lb 8 oz (82.8 kg)   LMP 05/29/2016   BMI 31.33 kg/m    GEN: Well-developed,well-nourished,in no acute distress; alert,appropriate and cooperative throughout examination HEENT: Normocephalic and atraumatic without obvious abnormalities. Ears, externally no deformities PULM: Breathing comfortably in no respiratory distress EXT: No clubbing, cyanosis, or edema PSYCH: Normally interactive. Cooperative during the  interview. Pleasant. Friendly and conversant. Not anxious or depressed appearing. Normal, full affect.  CERVICAL SPINE EXAM Range of motion: Flexion, extension, lateral bending, and rotation:  Pain with terminal motion: modest decreased range of motion only Spinous Processes: NT SCM: NT Upper paracervical muscles: tender to palpation Upper traps: tender to palpation and markedly so in the parascapular region. C5-T1 intact, sensation and motor   Shoulder: R Inspection: No muscle wasting or winging Ecchymosis/edema: neg  AC joint, scapula, clavicle: NT Cervical spine: NT, full ROM Spurling's: neg Abduction: full, 5/5 Flexion: full, 5/5 IR, full, lift-off: 5/5 ER at neutral: full, 5/5 AC crossover: neg Neer: pos Hawkins: pos - minimal/mild Drop Test: neg Empty Can: neg Supraspinatus insertion: mild-mod T Bicipital groove: NT Speed's: neg Yergason's: neg Sulcus sign: neg Scapular dyskinesis: none C5-T1 intact  Neuro: Sensation intact Grip 5/5   Radiology: Dg Cervical Spine Complete  Result Date: 06/13/2016 CLINICAL DATA:  Acute onset of right scapular pain with radiculopathy. Initial encounter. EXAM: CERVICAL SPINE - COMPLETE 4+ VIEW COMPARISON:  None. FINDINGS: There is no evidence of fracture or subluxation. Absence of the normal lordotic curvature of the cervical spine is likely positional in nature. Vertebral bodies demonstrate normal height and alignment. Intervertebral disc spaces are preserved. Prevertebral soft tissues are within normal limits. The provided odontoid view demonstrates no significant abnormality. The visualized lung apices are clear. IMPRESSION: No evidence of fracture or subluxation along the cervical spine. Electronically Signed   By: Garald Balding M.D.   On: 06/13/2016 01:55   Dg Shoulder Right  Result Date: 06/13/2016 CLINICAL DATA:  Acute onset of right scapular pain and radiculopathy. Initial encounter. EXAM: RIGHT SHOULDER - 2+ VIEW COMPARISON:   None. FINDINGS: There is no evidence of fracture or dislocation. The right humeral head is seated within the glenoid fossa. The right scapula appears intact. Mild superior displacement of the distal clavicle with respect to the acromion raises concern for [place] type 2 acromioclavicular joint injury. No significant soft tissue abnormalities are seen. The visualized portions of the right lung are clear. IMPRESSION: 1. No evidence of fracture or dislocation. 2. Mild superior displacement of the distal clavicle with respect to the acromion raises concern for [place] type 2 acromioclavicular joint injury. Would correlate for any associated symptoms. Electronically Signed   By: Garald Balding M.D.   On: 06/13/2016 01:57    Assessment and Plan:   Right shoulder pain, unspecified chronicity - Plan: DG Cervical Spine Complete, DG Shoulder Right  Radiculopathy, unspecified spinal region   Shoulder blade pain and radicular symptoms, which seem to be most likely referred from the neck.  Rotator cuff symptoms have calm down.  Rotator cuff is very strong.  Possible prior a.c. Joint separation, but not clinically active.  Continue basic therapy.  Scapular stabilization encouraged.  Again neuropathic pain medication, Elavil 25  mg at nighttime, titrate up to 50 mg if possible.  I would favor obtaining an MRI of the patient's cervical spine to evaluate for nerve encroachment and degree.  She is going to check from a financial standpoint an insurance standpoint.  Failure to improve with conservative measures thus far and a multi-month timeframe.  Follow-up: Return in about 4 weeks (around 07/10/2016).  Meds ordered this encounter  Medications  . amitriptyline (ELAVIL) 25 MG tablet    Sig: Take 1 tablet (25 mg total) by mouth at bedtime.    Dispense:  30 tablet    Refill:  3   There are no discontinued medications. Orders Placed This Encounter  Procedures  . DG Cervical Spine Complete  . DG Shoulder  Right    Signed,  Frederico Hamman T. Jahari Billy, MD   Allergies as of 06/12/2016      Reactions   Doxycycline Shortness Of Breath      Medication List       Accurate as of 06/12/16 11:59 PM. Always use your most recent med list.          albuterol 108 (90 Base) MCG/ACT inhaler Commonly known as:  PROVENTIL HFA;VENTOLIN HFA Inhale 2 puffs into the lungs every 6 (six) hours as needed for wheezing or shortness of breath.   amitriptyline 25 MG tablet Commonly known as:  ELAVIL Take 1 tablet (25 mg total) by mouth at bedtime.   omeprazole 20 MG capsule Commonly known as:  PRILOSEC Take 1 capsule (20 mg total) by mouth 2 (two) times daily before a meal.   traMADol 50 MG tablet Commonly known as:  ULTRAM Take 1 tablet (50 mg total) by mouth every 6 (six) hours as needed.   TRI-SPRINTEC 0.18/0.215/0.25 MG-35 MCG tablet Generic drug:  Norgestimate-Ethinyl Estradiol Triphasic Take 1 tablet by mouth daily.

## 2016-06-13 ENCOUNTER — Encounter: Payer: Self-pay | Admitting: Family Medicine

## 2016-06-13 NOTE — Telephone Encounter (Signed)
This letter written for the patient's employer.

## 2016-06-19 ENCOUNTER — Encounter: Payer: Self-pay | Admitting: Family Medicine

## 2016-06-30 ENCOUNTER — Ambulatory Visit: Payer: Self-pay | Admitting: Primary Care

## 2016-07-10 ENCOUNTER — Ambulatory Visit (INDEPENDENT_AMBULATORY_CARE_PROVIDER_SITE_OTHER): Payer: BLUE CROSS/BLUE SHIELD | Admitting: Family Medicine

## 2016-07-10 ENCOUNTER — Encounter: Payer: Self-pay | Admitting: Family Medicine

## 2016-07-10 VITALS — BP 112/80 | HR 82 | Temp 98.6°F | Ht 64.0 in | Wt 187.2 lb

## 2016-07-10 DIAGNOSIS — M25511 Pain in right shoulder: Secondary | ICD-10-CM

## 2016-07-10 DIAGNOSIS — M5412 Radiculopathy, cervical region: Secondary | ICD-10-CM

## 2016-07-10 MED ORDER — TRIAMCINOLONE ACETONIDE 0.1 % EX CREA: 1.0000 "application " | TOPICAL_CREAM | Freq: Two times a day (BID) | CUTANEOUS | 0 refills | Status: DC

## 2016-07-10 MED ORDER — AMITRIPTYLINE HCL 50 MG PO TABS: 50.0000 mg | ORAL_TABLET | Freq: Every day | ORAL | 1 refills | Status: DC

## 2016-07-10 NOTE — Progress Notes (Signed)
Dr. Frederico Hamman T. Samanvitha Germany, MD, [facility] [place], [address] Phone: [phone] Fax: [phone]  07/10/2016  Patient: Bonnie Mathews, MRN: [id number], DOB: 1986-04-23, 31 y.o.  Primary Physician:  Sheral Flow, NP   Chief Complaint  Patient presents with  . Follow-up    Right Shoulder Pain  . Rash    on Arms   Subjective:   Bonnie Mathews is a 31 y.o. very pleasant female patient who presents with the following:  f/u R shoulder and radiculopathy:  Now on Elavil - sharp stabbing pain and shoulder blade pain is doing better.   Overall, she says she is approximately 50% better, possibly even more than this.  She is currently on amitriptyline 25 mg at night, and she is also been doing physical therapy one day a week and been very diligent with her home therapy program.  Rash. Itchy on arms  Past Medical History, Surgical History, Social History, Family History, Problem List, Medications, and Allergies have been reviewed and updated if relevant.  Patient Active Problem List   Diagnosis Date Noted  . Preventative health care 09/29/2015  . Numbness and tingling of right upper extremity 06/09/2015  . ALLERGIC RHINITIS, SEASONAL 03/06/2008  . TOBACCO ABUSE 02/14/2008    Past Medical History:  Diagnosis Date  . Chickenpox   . GERD (gastroesophageal reflux disease)   . Peptic ulcer   . Tobacco abuse     Past Surgical History:  Procedure Laterality Date  . TONSILLECTOMY AND ADENOIDECTOMY  1993    Social History   Social History  . Marital status: Single    Spouse name: N/A  . Number of children: N/A  . Years of education: N/A   Occupational History  . Not on file.   Social History Main Topics  . Smoking status: Current Every Day Smoker    Packs/day: 1.00    Types: Cigarettes  . Smokeless tobacco: Never Used  . Alcohol use 1.8 oz/week    3 Cans of beer per week  . Drug use: No  .  Sexual activity: Not on file   Other Topics Concern  . Not on file   Social History Narrative   Single.   1 child.   Works as a Physiological scientist   Enjoys working out and watching TV.   She's working on her GED.     Family History  Problem Relation Age of Onset  . Arthritis Mother   . Heart disease Mother   . Diabetes Mother   . Hypertension Father   . Arthritis Maternal Aunt   . Breast cancer Maternal Aunt   . Alcohol abuse Paternal Uncle   . Mental illness Maternal Grandmother   . Mental illness Maternal Grandfather   . Colon cancer Paternal Grandmother   . Mental illness Paternal Grandmother   . Alcohol abuse Paternal Grandfather   . Mental illness Paternal Grandfather     Allergies  Allergen Reactions  . Doxycycline Shortness Of Breath    Medication list reviewed and updated in full in [facility].  GEN: no acute illness or fever CV: No chest pain or shortness of breath MSK: detailed above Neuro: neurological signs are described above ROS O/w per HPI  Objective:   BP 112/80   Pulse 82   Temp 98.6 F (37 C) (Oral)   Ht 5\' 4"  (1.626 m)   Wt 187 lb 4 oz (84.9 kg)   LMP  06/26/2016   BMI 32.14 kg/m    GEN: WDWN, NAD, Non-toxic, Alert & Oriented x 3 HEENT: Atraumatic, Normocephalic.  Ears and Nose: No external deformity. EXTR: No clubbing/cyanosis/edema NEURO: Normal gait.  PSYCH: Normally interactive. Conversant. Not depressed or anxious appearing.  Calm demeanor.   Shoulder: R Inspection: No muscle wasting or winging Ecchymosis/edema: neg  AC joint, scapula, clavicle: NT Cervical spine: NT, full ROM Spurling's: neg Abduction: full, 5/5 Flexion: full, 5/5 IR, full, lift-off: 5/5 ER at neutral: full, 5/5 AC crossover and compression: neg Neer: neg Hawkins: neg Drop Test: neg Empty Can: neg Supraspinatus insertion: NT Bicipital groove: NT Speed's: neg Yergason's: neg Sulcus sign: neg Scapular dyskinesis: none C5-T1 intact Sensation  intact Grip 5/5   Radiology: Dg Cervical Spine Complete  Result Date: 06/13/2016 CLINICAL DATA:  Acute onset of right scapular pain with radiculopathy. Initial encounter. EXAM: CERVICAL SPINE - COMPLETE 4+ VIEW COMPARISON:  None. FINDINGS: There is no evidence of fracture or subluxation. Absence of the normal lordotic curvature of the cervical spine is likely positional in nature. Vertebral bodies demonstrate normal height and alignment. Intervertebral disc spaces are preserved. Prevertebral soft tissues are within normal limits. The provided odontoid view demonstrates no significant abnormality. The visualized lung apices are clear. IMPRESSION: No evidence of fracture or subluxation along the cervical spine. Electronically Signed   By: Garald Balding M.D.   On: 06/13/2016 01:55   Dg Shoulder Right  Result Date: 06/13/2016 CLINICAL DATA:  Acute onset of right scapular pain and radiculopathy. Initial encounter. EXAM: RIGHT SHOULDER - 2+ VIEW COMPARISON:  None. FINDINGS: There is no evidence of fracture or dislocation. The right humeral head is seated within the glenoid fossa. The right scapula appears intact. Mild superior displacement of the distal clavicle with respect to the acromion raises concern for [place] type 2 acromioclavicular joint injury. No significant soft tissue abnormalities are seen. The visualized portions of the right lung are clear. IMPRESSION: 1. No evidence of fracture or dislocation. 2. Mild superior displacement of the distal clavicle with respect to the acromion raises concern for [place] type 2 acromioclavicular joint injury. Would correlate for any associated symptoms. Electronically Signed   By: Garald Balding M.D.   On: 06/13/2016 01:57    Assessment and Plan:   Right cervical radiculopathy  Right shoulder pain, unspecified chronicity   Doing much better.  Increase Elavil dosing to 50 mg at night.  Continue with physical therapy and home exercise  program.  Kenalog cream for rash on arms.  Continue with limited light-duty capacity, follow-up in 2 months.  Follow-up: Return in about 2 months (around 09/09/2016).  Meds ordered this encounter  Medications  . triamcinolone cream (KENALOG) 0.1 %    Sig: Apply 1 application topically 2 (two) times daily.    Dispense:  454 g    Refill:  0  . amitriptyline (ELAVIL) 50 MG tablet    Sig: Take 1 tablet (50 mg total) by mouth at bedtime.    Dispense:  90 tablet    Refill:  1    For her refills - increasing dose today to 50 mg   Medications Discontinued During This Encounter  Medication Reason  . amitriptyline (ELAVIL) 25 MG tablet Reorder   No orders of the defined types were placed in this encounter.   Signed,  Bonnie Deed. Justyn Langham, MD   Allergies as of 07/10/2016      Reactions   Doxycycline Shortness Of Breath      Medication List  Accurate as of 07/10/16 11:59 PM. Always use your most recent med list.          albuterol 108 (90 Base) MCG/ACT inhaler Commonly known as:  PROVENTIL HFA;VENTOLIN HFA Inhale 2 puffs into the lungs every 6 (six) hours as needed for wheezing or shortness of breath.   amitriptyline 50 MG tablet Commonly known as:  ELAVIL Take 1 tablet (50 mg total) by mouth at bedtime.   omeprazole 20 MG capsule Commonly known as:  PRILOSEC Take 1 capsule (20 mg total) by mouth 2 (two) times daily before a meal.   traMADol 50 MG tablet Commonly known as:  ULTRAM Take 1 tablet (50 mg total) by mouth every 6 (six) hours as needed.   TRI-SPRINTEC 0.18/0.215/0.25 MG-35 MCG tablet Generic drug:  Norgestimate-Ethinyl Estradiol Triphasic Take 1 tablet by mouth daily.   triamcinolone cream 0.1 % Commonly known as:  KENALOG Apply 1 application topically 2 (two) times daily.

## 2016-07-10 NOTE — Progress Notes (Signed)
Pre visit review using our clinic review tool, if applicable. No additional management support is needed unless otherwise documented below in the visit note. 

## 2016-07-17 ENCOUNTER — Other Ambulatory Visit: Payer: Self-pay | Admitting: Primary Care

## 2016-07-17 DIAGNOSIS — Z789 Other specified health status: Secondary | ICD-10-CM

## 2016-07-27 ENCOUNTER — Ambulatory Visit (INDEPENDENT_AMBULATORY_CARE_PROVIDER_SITE_OTHER): Payer: BLUE CROSS/BLUE SHIELD | Admitting: Primary Care

## 2016-07-27 ENCOUNTER — Encounter: Payer: Self-pay | Admitting: Primary Care

## 2016-07-27 VITALS — BP 120/82 | HR 107 | Temp 98.1°F | Ht 64.0 in | Wt 188.1 lb

## 2016-07-27 DIAGNOSIS — M722 Plantar fascial fibromatosis: Secondary | ICD-10-CM | POA: Diagnosis not present

## 2016-07-27 NOTE — Progress Notes (Signed)
Pre visit review using our clinic review tool, if applicable. No additional management support is needed unless otherwise documented below in the visit note. 

## 2016-07-27 NOTE — Patient Instructions (Addendum)
Your symptoms resemble plantar fasciitis which is irritation to the tissue lining of your foot.  Start working out the tissue with a soup can as discussed today. Try to do this when resting at home.  Purchase heel cups or insoles for your shoes while at work and during workouts.  You may take naproxen (Aleve) tablets as needed for pain and inflammation.   Please notify me if no improvement in 2 weeks.  It was a pleasure to see you today!   Plantar Fasciitis Plantar fasciitis is a painful foot condition that affects the heel. It occurs when the band of tissue that connects the toes to the heel bone (plantar fascia) becomes irritated. This can happen after exercising too much or doing other repetitive activities (overuse injury). The pain from plantar fasciitis can range from mild irritation to severe pain that makes it difficult for you to walk or move. The pain is usually worse in the morning or after you have been sitting or lying down for a while. What are the causes? This condition may be caused by:  Standing for long periods of time.  Wearing shoes that do not fit.  Doing high-impact activities, including running, aerobics, and ballet.  Being overweight.  Having an abnormal way of walking (gait).  Having tight calf muscles.  Having high arches in your feet.  Starting a new athletic activity. What are the signs or symptoms? The main symptom of this condition is heel pain. Other symptoms include:  Pain that gets worse after activity or exercise.  Pain that is worse in the morning or after resting.  Pain that goes away after you walk for a few minutes. How is this diagnosed? This condition may be diagnosed based on your signs and symptoms. Your health care provider will also do a physical exam to check for:  A tender area on the bottom of your foot.  A high arch in your foot.  Pain when you move your foot.  Difficulty moving your foot. You may also need to have  imaging studies to confirm the diagnosis. These can include:  X-rays.  Ultrasound.  MRI. How is this treated? Treatment for plantar fasciitis depends on the severity of the condition. Your treatment may include:  Rest, ice, and over-the-counter pain medicines to manage your pain.  Exercises to stretch your calves and your plantar fascia.  A splint that holds your foot in a stretched, upward position while you sleep (night splint).  Physical therapy to relieve symptoms and prevent problems in the future.  Cortisone injections to relieve severe pain.  Extracorporeal shock wave therapy (ESWT) to stimulate damaged plantar fascia with electrical impulses. It is often used as a last resort before surgery.  Surgery, if other treatments have not worked after 12 months. Follow these instructions at home:  Take medicines only as directed by your health care provider.  Avoid activities that cause pain.  Roll the bottom of your foot over a bag of ice or a bottle of cold water. Do this for 20 minutes, 3-4 times a day.  Perform simple stretches as directed by your health care provider.  Try wearing athletic shoes with air-sole or gel-sole cushions or soft shoe inserts.  Wear a night splint while sleeping, if directed by your health care provider.  Keep all follow-up appointments with your health care provider. How is this prevented?  Do not perform exercises or activities that cause heel pain.  Consider finding low-impact activities if you continue to have  problems.  Lose weight if you need to. The best way to prevent plantar fasciitis is to avoid the activities that aggravate your plantar fascia. Contact a health care provider if:  Your symptoms do not go away after treatment with home care measures.  Your pain gets worse.  Your pain affects your ability to move or do your daily activities. This information is not intended to replace advice given to you by your health care  provider. Make sure you discuss any questions you have with your health care provider. Document Released: 01/03/2001 Document Revised: 09/13/2015 Document Reviewed: 02/18/2014 Elsevier Interactive Patient Education  2017 [employer].

## 2016-07-27 NOTE — Progress Notes (Signed)
Subjective:    Patient ID: Bonnie Mathews, female    DOB: 1986-03-11, 31 y.o.   MRN: [id number]  HPI  Bonnie Mathews is a 31 year old female who presents today with a chief complaint of heel pain. Her pain is located to the right heel. Her pain is worse with pressure. Her pain was intense last night which woke her from sleep. She recently started back at the gym 2 weeks ago which is when symptoms began. She's changed her shoes, wore extra socks for cushion, wrapped her heel with an ACE wrap, applied ice without much improvement. She's taken Tylenol as well without much improvement.  She is on her feet at work for 8 hours daily on average. She denies recent injury/trauma.   Review of Systems  Musculoskeletal:       Plantar heel pain  Skin: Negative for color change.  Neurological: Negative for weakness.       Past Medical History:  Diagnosis Date  . Chickenpox   . GERD (gastroesophageal reflux disease)   . Peptic ulcer   . Tobacco abuse      Social History   Social History  . Marital status: Single    Spouse name: N/A  . Number of children: N/A  . Years of education: N/A   Occupational History  . Not on file.   Social History Main Topics  . Smoking status: Current Every Day Smoker    Packs/day: 1.00    Types: Cigarettes  . Smokeless tobacco: Never Used  . Alcohol use 1.8 oz/week    3 Cans of beer per week  . Drug use: No  . Sexual activity: Not on file   Other Topics Concern  . Not on file   Social History Narrative   Single.   1 child.   Works as a Physiological scientist   Enjoys working out and watching TV.   She's working on her GED.     Past Surgical History:  Procedure Laterality Date  . TONSILLECTOMY AND ADENOIDECTOMY  1993    Family History  Problem Relation Age of Onset  . Arthritis Mother   . Heart disease Mother   . Diabetes Mother   . Hypertension Father   . Arthritis Maternal Aunt   . Breast cancer Maternal Aunt   . Alcohol abuse Paternal Uncle   .  Mental illness Maternal Grandmother   . Mental illness Maternal Grandfather   . Colon cancer Paternal Grandmother   . Mental illness Paternal Grandmother   . Alcohol abuse Paternal Grandfather   . Mental illness Paternal Grandfather     Allergies  Allergen Reactions  . Doxycycline Shortness Of Breath    Current Outpatient Prescriptions on File Prior to Visit  Medication Sig Dispense Refill  . albuterol (PROVENTIL HFA;VENTOLIN HFA) 108 (90 Base) MCG/ACT inhaler Inhale 2 puffs into the lungs every 6 (six) hours as needed for wheezing or shortness of breath. 1 Inhaler 3  . amitriptyline (ELAVIL) 50 MG tablet Take 1 tablet (50 mg total) by mouth at bedtime. 90 tablet 1  . omeprazole (PRILOSEC) 20 MG capsule Take 1 capsule (20 mg total) by mouth 2 (two) times daily before a meal. 20 capsule 0  . traMADol (ULTRAM) 50 MG tablet Take 1 tablet (50 mg total) by mouth every 6 (six) hours as needed. 50 tablet 1  . TRI-SPRINTEC 0.18/0.215/0.25 MG-35 MCG tablet TAKE 1 TABLET BY MOUTH DAILY. 84 tablet 1  . triamcinolone cream (KENALOG) 0.1 % Apply  1 application topically 2 (two) times daily. 454 g 0   No current facility-administered medications on file prior to visit.     BP 120/82   Pulse (!) 107   Temp 98.1 F (36.7 C) (Oral)   Ht 5\' 4"  (1.626 m)   Wt 188 lb 1.9 oz (85.3 kg)   LMP 06/26/2016   SpO2 98%   BMI 32.29 kg/m    Objective:   Physical Exam  Cardiovascular:  Pulses:      Dorsalis pedis pulses are 2+ on the right side.       Posterior tibial pulses are 2+ on the right side.  Musculoskeletal:       Right ankle: She exhibits normal range of motion.       Feet:  Non tender upon palpation.  Skin: Skin is warm and dry. No erythema.          Assessment & Plan:  Plantar Fasciitis vs Heel Spur:  Pain located to right heel x 2 weeks. Likely irritated fascia given recent increase in exercise. Exam overall unremarkable. Will have her complete exercises at home; add  insoles/heel cups to shoes; try Aleve PRN; refrain from vigorous cardio exercise for now. She will notify if no improvement in 2 weeks.  Sheral Flow, NP

## 2016-08-23 ENCOUNTER — Other Ambulatory Visit: Payer: Self-pay | Admitting: Family Medicine

## 2016-08-23 ENCOUNTER — Encounter: Payer: Self-pay | Admitting: Primary Care

## 2016-08-23 ENCOUNTER — Encounter: Payer: Self-pay | Admitting: Family Medicine

## 2016-08-23 DIAGNOSIS — M79671 Pain in right foot: Secondary | ICD-10-CM

## 2016-08-24 NOTE — Telephone Encounter (Signed)
Ok to refill #50, 2 refills 

## 2016-08-24 NOTE — Telephone Encounter (Signed)
Last office visit 07/27/2016 with Gentry Fitz. Last refilled 04/05/2016 for #50 with 1 refill.  Ok to refill?

## 2016-08-24 NOTE — Telephone Encounter (Signed)
See refill request.

## 2016-08-24 NOTE — Telephone Encounter (Signed)
Will send to treating MD.

## 2016-08-25 NOTE — Telephone Encounter (Signed)
Tramadol called into [place], [place] Phone: [phone]

## 2016-09-13 ENCOUNTER — Ambulatory Visit: Payer: BLUE CROSS/BLUE SHIELD | Admitting: Family Medicine

## 2016-09-14 ENCOUNTER — Ambulatory Visit (INDEPENDENT_AMBULATORY_CARE_PROVIDER_SITE_OTHER): Payer: BLUE CROSS/BLUE SHIELD | Admitting: Family Medicine

## 2016-09-14 ENCOUNTER — Encounter: Payer: Self-pay | Admitting: Family Medicine

## 2016-09-14 VITALS — BP 100/66 | HR 90 | Temp 98.7°F | Ht 64.0 in | Wt 194.5 lb

## 2016-09-14 DIAGNOSIS — M5412 Radiculopathy, cervical region: Secondary | ICD-10-CM

## 2016-09-14 MED ORDER — FLUCONAZOLE 150 MG PO TABS: ORAL_TABLET | ORAL | 1 refills | Status: DC

## 2016-09-14 NOTE — Progress Notes (Signed)
Dr. Frederico Hamman T. Samad Thon, MD, Bonnie Mathews, [address] Phone: [phone] Fax: [phone]  09/14/2016  Patient: Bonnie Mathews, MRN: [id number], DOB: 22-Dec-1985, 31 y.o.  Primary Physician:  Bonnie Koch, NP   Chief Complaint  Patient presents with  . Follow-up    Right Shoulder   Subjective:   Bonnie Mathews is a 31 y.o. very pleasant female patient who presents with the following:  F/u c spine - radiati on and pain in the shoulder on the R  Some days are good - sometimes will have some jabbing and shooting down the arm.  Few and far between.   ? Tinea versicolor  Pleasant young woman who is seen on the number of months previously presents for follow-up on right-sided cervical radiculopathy with cervical origin of right-sided shoulder pain. She's completed physical therapy and multiple other modalities. I started her on some cyclic antidepressants, she squared 50% improved on improvement. She also changed her work duties, and now is only rarely having symptoms.  She also has a flat macular rash with red scale, light in character. Triamcinolone has not improved this.  Past Medical History, Surgical History, Social History, Family History, Problem List, Medications, and Allergies have been reviewed and updated if relevant.  Patient Active Problem List   Diagnosis Date Noted  . Preventative health care 09/29/2015  . Numbness and tingling of right upper extremity 06/09/2015  . ALLERGIC RHINITIS, SEASONAL 03/06/2008  . TOBACCO ABUSE 02/14/2008    Past Medical History:  Diagnosis Date  . Chickenpox   . GERD (gastroesophageal reflux disease)   . Peptic ulcer   . Tobacco abuse     Past Surgical History:  Procedure Laterality Date  . TONSILLECTOMY AND ADENOIDECTOMY  1993    Social History   Social History  . Marital status: Single    Spouse name: N/A  . Number of children: N/A  . Years of  education: N/A   Occupational History  . Not on file.   Social History Main Topics  . Smoking status: Current Every Day Smoker    Packs/day: 1.00    Types: Cigarettes  . Smokeless tobacco: Never Used  . Alcohol use 1.8 oz/week    3 Cans of beer per week  . Drug use: No  . Sexual activity: Not on file   Other Topics Concern  . Not on file   Social History Narrative   Single.   1 child.   Works as a Physiological scientist   Enjoys working out and watching TV.   She's working on her GED.     Family History  Problem Relation Age of Onset  . Arthritis Mother   . Heart disease Mother   . Diabetes Mother   . Hypertension Father   . Arthritis Maternal Aunt   . Breast cancer Maternal Aunt   . Alcohol abuse Paternal Uncle   . Mental illness Maternal Grandmother   . Mental illness Maternal Grandfather   . Colon cancer Paternal Grandmother   . Mental illness Paternal Grandmother   . Alcohol abuse Paternal Grandfather   . Mental illness Paternal Grandfather     Allergies  Allergen Reactions  . Doxycycline Shortness Of Breath    Medication list reviewed and updated in full in [facility].  GEN: No fevers, chills. Nontoxic. Primarily MSK c/o today. MSK: Detailed in the HPI GI: tolerating PO intake without difficulty Neuro: No numbness,  parasthesias, or tingling associated. Otherwise the pertinent positives of the ROS are noted above.   Objective:   BP 100/66   Pulse 90   Temp 98.7 F (37.1 C) (Oral)   Ht 5\' 4"  (1.626 m)   Wt 194 lb 8 oz (88.2 kg)   LMP 09/12/2016   BMI 33.39 kg/m    GEN: Well-developed,well-nourished,in no acute distress; alert,appropriate and cooperative throughout examination HEENT: Normocephalic and atraumatic without obvious abnormalities. Ears, externally no deformities PULM: Breathing comfortably in no respiratory distress EXT: No clubbing, cyanosis, or edema PSYCH: Normally interactive. Cooperative during the interview. Pleasant. Friendly and  conversant. Not anxious or depressed appearing. Normal, full affect.  CERVICAL SPINE EXAM Range of motion: Flexion, extension, lateral bending, and rotation: Full Pain with terminal motion: Minimal Spinous Processes: NT SCM: NT Upper paracervical muscles: mild Upper traps: NT C5-T1 intact, sensation and motor   Michel Bickers and Neer testing is negative  Radiology: No results found.  Assessment and Plan:   Right cervical radiculopathy  Doing well and at a satisfactory level for her. Not completely recovered. Increase Elavil dosing to 100 milligrams. She'll take 2 tablets a day for now and let me know how this goes after for 5 days.  Classic tinea versicolor, treat as such.  Follow-up: No Follow-up on file.  Meds ordered this encounter  Medications  . fluconazole (DIFLUCAN) 150 MG tablet    Sig: 2 tabs po once now and repeat 2 tablets in 7 days    Dispense:  4 tablet    Refill:  1   There are no discontinued medications. No orders of the defined types were placed in this encounter.   Signed,  Maud Deed. Rindi Beechy, MD   Allergies as of 09/14/2016      Reactions   Doxycycline Shortness Of Breath      Medication List       Accurate as of 09/14/16  3:46 PM. Always use your most recent med list.          albuterol 108 (90 Base) MCG/ACT inhaler Commonly known as:  PROVENTIL HFA;VENTOLIN HFA Inhale 2 puffs into the lungs every 6 (six) hours as needed for wheezing or shortness of breath.   amitriptyline 50 MG tablet Commonly known as:  ELAVIL Take 1 tablet (50 mg total) by mouth at bedtime.   fluconazole 150 MG tablet Commonly known as:  DIFLUCAN 2 tabs po once now and repeat 2 tablets in 7 days   omeprazole 20 MG capsule Commonly known as:  PRILOSEC Take 1 capsule (20 mg total) by mouth 2 (two) times daily before a meal.   traMADol 50 MG tablet Commonly known as:  ULTRAM TAKE ONE TABLET BY MOUTH EVERY 6 HOURS AS NEEDED FOR PAIN   TRI-SPRINTEC  0.18/0.215/0.25 MG-35 MCG tablet Generic drug:  Norgestimate-Ethinyl Estradiol Triphasic TAKE 1 TABLET BY MOUTH DAILY.   triamcinolone cream 0.1 % Commonly known as:  KENALOG Apply 1 application topically 2 (two) times daily.

## 2016-09-14 NOTE — Patient Instructions (Signed)
MyChart me on Tuesday if you tolerate the increase to 100 mg on your amitryptiline.

## 2016-09-21 ENCOUNTER — Encounter: Payer: Self-pay | Admitting: Primary Care

## 2016-10-03 ENCOUNTER — Encounter: Payer: Self-pay | Admitting: Family Medicine

## 2016-10-03 ENCOUNTER — Ambulatory Visit (INDEPENDENT_AMBULATORY_CARE_PROVIDER_SITE_OTHER): Payer: BLUE CROSS/BLUE SHIELD

## 2016-10-03 ENCOUNTER — Encounter: Payer: Self-pay | Admitting: Podiatry

## 2016-10-03 ENCOUNTER — Ambulatory Visit (INDEPENDENT_AMBULATORY_CARE_PROVIDER_SITE_OTHER): Payer: BLUE CROSS/BLUE SHIELD | Admitting: Podiatry

## 2016-10-03 DIAGNOSIS — M722 Plantar fascial fibromatosis: Secondary | ICD-10-CM

## 2016-10-03 DIAGNOSIS — G5791 Unspecified mononeuropathy of right lower limb: Secondary | ICD-10-CM | POA: Diagnosis not present

## 2016-10-05 ENCOUNTER — Telehealth: Payer: Self-pay | Admitting: Podiatry

## 2016-10-05 NOTE — Telephone Encounter (Signed)
Patient was seen here on 10/03/16. Dr. Amalia Hailey needed a [phone] for the patient but we were out of stock. They came in today, and the patient stopped by to pick it up.  No charges have been entered yet for this dos, please enter the charge for the L-1902 once you have all of the other charges entered for that date.  Thanks!  I didn't want to cause a duplicate charge by entering it myself. [place]

## 2016-10-12 NOTE — Progress Notes (Signed)
   Subjective: Patient presents today for intermittent sharp, tingling pain and tenderness in the right plantar heel and dorsum of foot to the toes that has been ongoing for the past 3 months. Patient states that it hurts in the mornings with the first steps out of bed. Patient presents today for further treatment and evaluation  Objective: Physical Exam General: The patient is alert and oriented x3 in no acute distress.  Dermatology: Skin is warm, dry and supple bilateral lower extremities. Negative for open lesions or macerations bilateral.   Vascular: Dorsalis Pedis and Posterior Tibial pulses palpable bilateral.  Capillary fill time is immediate to all digits.  Neurological: Epicritic and protective threshold intact bilateral.   Musculoskeletal: Tenderness to palpation at the medial calcaneal tubercale and through the insertion of the plantar fascia of the right foot. All other joints range of motion within normal limits bilateral. Strength 5/5 in all groups bilateral.   Radiographic exam: Normal osseous mineralization. Joint spaces preserved. No fracture/dislocation/boney destruction. Calcaneal spur present with mild thickening of plantar fascia right. No other soft tissue abnormalities or radiopaque foreign bodies.   Assessment: 1. Plantar fasciitis right 2. Pain in right foot  Plan of Care:  1. Patient evaluated. Xrays reviewed.   2. Injection of 0.5cc Celestone soluspan injected into the right heel at the insertion of the plantar fascia.  3. Instructed patient regarding therapies and modalities at home to alleviate symptoms.  4. Plantar fascial band(s) dispensed. 5. Recommended good shoe gear not constricting the dorsum of the foot. 6. Return to clinic in 4 weeks.     Edrick Kins, DPM [facility]  Dr. Edrick Kins, [place]                                        [place], [place] [address]                Office [phone]  Fax [phone]

## 2016-10-16 MED ORDER — BETAMETHASONE SOD PHOS & ACET 6 (3-3) MG/ML IJ SUSP: 3.0000 mg | Freq: Once | INTRAMUSCULAR | Status: DC

## 2016-10-23 ENCOUNTER — Encounter: Payer: Self-pay | Admitting: Primary Care

## 2016-11-03 ENCOUNTER — Ambulatory Visit: Payer: BLUE CROSS/BLUE SHIELD | Admitting: Podiatry

## 2016-11-07 ENCOUNTER — Ambulatory Visit (INDEPENDENT_AMBULATORY_CARE_PROVIDER_SITE_OTHER): Payer: BLUE CROSS/BLUE SHIELD | Admitting: Podiatry

## 2016-11-07 DIAGNOSIS — M722 Plantar fascial fibromatosis: Secondary | ICD-10-CM

## 2016-11-07 NOTE — Progress Notes (Signed)
   Subjective: Patient presents today for follow-up treatment and evaluation of plantar fasciitis to the right foot. Patient states that she still feels mild intermittent pain however she is much better. Patient presents today for further treatment and evaluation  Objective: Physical Exam General: The patient is alert and oriented x3 in no acute distress.  Dermatology: Skin is warm, dry and supple bilateral lower extremities. Negative for open lesions or macerations bilateral.   Vascular: Dorsalis Pedis and Posterior Tibial pulses palpable bilateral.  Capillary fill time is immediate to all digits.  Neurological: Epicritic and protective threshold intact bilateral.   Musculoskeletal: Negative for Tenderness to palpation at the medial calcaneal tubercale and through the insertion of the plantar fascia of the right foot. All other joints range of motion within normal limits bilateral. Strength 5/5 in all groups bilateral.   Assessment: 1. Plantar fasciitis right-healed 2. Pain in right foot  Plan of Care:  1. Patient evaluated.  2. Continue conservative modalities including plantar fascial band and good shoe gear with stretching exercises. 3. Return to clinic when necessary    Edrick Kins, DPM [facility]  Dr. Edrick Kins, [place]                                        [place], [place] [address]                Office [phone]  Fax [phone]

## 2016-11-28 ENCOUNTER — Encounter: Payer: Self-pay | Admitting: Family Medicine

## 2016-11-28 ENCOUNTER — Ambulatory Visit (INDEPENDENT_AMBULATORY_CARE_PROVIDER_SITE_OTHER): Payer: BLUE CROSS/BLUE SHIELD | Admitting: Podiatry

## 2016-11-28 DIAGNOSIS — M722 Plantar fascial fibromatosis: Secondary | ICD-10-CM

## 2016-11-29 MED ORDER — AMITRIPTYLINE HCL 100 MG PO TABS: 100.0000 mg | ORAL_TABLET | Freq: Every day | ORAL | 1 refills | Status: DC

## 2016-11-29 MED ORDER — TRAMADOL HCL 50 MG PO TABS: 50.0000 mg | ORAL_TABLET | Freq: Four times a day (QID) | ORAL | 3 refills | Status: DC | PRN

## 2016-11-29 MED ORDER — IBUPROFEN-FAMOTIDINE 800-26.6 MG PO TABS: ORAL_TABLET | ORAL | 1 refills | Status: DC

## 2016-11-29 NOTE — Telephone Encounter (Signed)
I upped the elavil to 100 mg. The error is most likely mine - it doesn't look like I did it.   Can you refill the tramadol for her?  Tramadol 50 mg, 1 po q 6 hours prn pain, #50, 3 refills

## 2016-11-29 NOTE — Telephone Encounter (Signed)
Tramadol called into [facility] [address].

## 2016-12-02 MED ORDER — BETAMETHASONE SOD PHOS & ACET 6 (3-3) MG/ML IJ SUSP: 3.0000 mg | Freq: Once | INTRAMUSCULAR | Status: DC

## 2016-12-02 NOTE — Progress Notes (Signed)
   Subjective: Patient presents today for follow-up treatment and evaluation of plantar fasciitis to the right foot. Patient states the right foot is worse. It is very painful to stand and walk. Patient is requesting injections today. She states that the plantar fascial brace helps.  Objective: Physical Exam General: The patient is alert and oriented x3 in no acute distress.  Dermatology: Skin is warm, dry and supple bilateral lower extremities. Negative for open lesions or macerations bilateral.   Vascular: Dorsalis Pedis and Posterior Tibial pulses palpable bilateral.  Capillary fill time is immediate to all digits.  Neurological: Epicritic and protective threshold intact bilateral.   Musculoskeletal: Tenderness to palpation at the medial calcaneal tubercale and through the insertion of the plantar fascia of the right foot. All other joints range of motion within normal limits bilateral. Strength 5/5 in all groups bilateral.     Assessment: 1. Plantar fasciitis right 2. Pain in right foot  Plan of Care:  1. Patient evaluated.    2. Injection of 0.5cc Celestone soluspan injected into the right plantar fascia  3. Continue plantar fascial brace 4. Today a prescription for Duexis was placed and sent to [facility]. Return to clinic in 4 weeks  Edrick Kins, DPM [facility]  Dr. Edrick Kins, DPM    [address], [place] [address]                Office [phone]  Fax [phone]

## 2016-12-14 ENCOUNTER — Encounter: Payer: Self-pay | Admitting: Emergency Medicine

## 2016-12-14 ENCOUNTER — Telehealth: Payer: Self-pay | Admitting: Primary Care

## 2016-12-14 ENCOUNTER — Emergency Department
Admission: EM | Admit: 2016-12-14 | Discharge: 2016-12-14 | Disposition: A | Discharge status: Home / Self Care | Payer: BLUE CROSS/BLUE SHIELD | Attending: Emergency Medicine | Admitting: Emergency Medicine

## 2016-12-14 DIAGNOSIS — G43109 Migraine with aura, not intractable, without status migrainosus: Secondary | ICD-10-CM | POA: Diagnosis not present

## 2016-12-14 DIAGNOSIS — Z79899 Other long term (current) drug therapy: Secondary | ICD-10-CM | POA: Diagnosis not present

## 2016-12-14 DIAGNOSIS — F17210 Nicotine dependence, cigarettes, uncomplicated: Secondary | ICD-10-CM | POA: Insufficient documentation

## 2016-12-14 DIAGNOSIS — R51 Headache: Secondary | ICD-10-CM | POA: Diagnosis present

## 2016-12-14 LAB — POCT PREGNANCY, URINE: Preg Test, Ur: NEGATIVE

## 2016-12-14 MED ORDER — DIPHENHYDRAMINE HCL 50 MG/ML IJ SOLN
12.5000 mg | INTRAMUSCULAR | Status: AC
  Administered 2016-12-14: 12.5 mg via INTRAVENOUS
  Filled 2016-12-14: qty 1

## 2016-12-14 MED ORDER — KETOROLAC TROMETHAMINE 30 MG/ML IJ SOLN
15.0000 mg | Freq: Once | INTRAMUSCULAR | Status: AC
Start: 2016-12-14 — End: 2016-12-14
  Administered 2016-12-14: 15 mg via INTRAVENOUS
  Filled 2016-12-14: qty 1

## 2016-12-14 MED ORDER — SODIUM CHLORIDE 0.9 % IV BOLUS (SEPSIS)
500.0000 mL | INTRAVENOUS | Status: AC
  Administered 2016-12-14: 500 mL via INTRAVENOUS

## 2016-12-14 MED ORDER — MAGNESIUM SULFATE 2 GM/50ML IV SOLN
2.0000 g | Freq: Once | INTRAVENOUS | Status: AC
  Administered 2016-12-14: 2 g via INTRAVENOUS
  Filled 2016-12-14: qty 50

## 2016-12-14 MED ORDER — METOCLOPRAMIDE HCL 5 MG/ML IJ SOLN
10.0000 mg | INTRAMUSCULAR | Status: AC
  Administered 2016-12-14: 10 mg via INTRAVENOUS
  Filled 2016-12-14: qty 2

## 2016-12-14 MED ORDER — DEXAMETHASONE SODIUM PHOSPHATE 10 MG/ML IJ SOLN
10.0000 mg | Freq: Once | INTRAMUSCULAR | Status: AC
  Administered 2016-12-14: 10 mg via INTRAVENOUS
  Filled 2016-12-14 (×2): qty 1

## 2016-12-14 NOTE — ED Notes (Signed)
Patient ambulating to bathroom with steady gait.

## 2016-12-14 NOTE — Telephone Encounter (Signed)
[facility]  Patient Name: Bonnie Mathews  DOB: 28-Aug-1985    Initial Comment Caller states she has a headache since Tuesday and having neck and jaw pain. She has been feeling pain in her shoulder. Having nasuea and dizziness.    Nurse Assessment  Nurse: Harlow Mares, RN, Suanne Marker Date/Time Eilene Ghazi Time): 12/14/2016 11:22:27 AM  Confirm and document reason for call. If symptomatic, describe symptoms. ---Caller states she has a headache since Tuesday and having neck and jaw pain. She has been feeling pain in her shoulder. Having nausea and dizziness. Denies chest/arm pain or SOB. Reports that the dizziness comes/goes. Room feels like it is spinning.  Does the patient have any new or worsening symptoms? ---Yes  Will a triage be completed? ---Yes  Related visit to physician within the last 2 weeks? ---No  Does the PT have any chronic conditions? (i.e. diabetes, asthma, etc.) ---Yes  List chronic conditions. ---nerve condition in the shoulder (being tx for now) takes Tramadol and Amitryptyline.  Is the patient pregnant or possibly pregnant? (Ask all females between the ages of 27-55) ---No  Is this a behavioral health or substance abuse call? ---No     Guidelines    Guideline Title Affirmed Question Affirmed Notes  Dizziness - Vertigo Severe headache (e.g., excruciating) (Exception: similar to previous migraines)    Final Disposition User   Go to ED Now (or PCP triage) Harlow Mares, RN, [place]    Referrals  GO TO [place] UNDECIDED   Disagree/Comply: Comply

## 2016-12-14 NOTE — ED Provider Notes (Signed)
[facility] Emergency Department Provider Note  ____________________________________________   First MD Initiated Contact with Patient 12/14/16 1519     (approximate)  I have reviewed the triage vital signs and the nursing notes.   HISTORY  Chief Complaint Headache    HPI Bonnie Mathews is a 31 y.o. female  who presents for evaluation of 2-3 days of headache.  She reports that it started gradually and has been persistent.  It feels like a generalized dull aching throbbing headache.  Nothing in particular makes it better.  Bright lights and loud noises make it worse.  It is accompanied with nausea.  She also states that she feels like the lights are pulsating and when she looks at blinds or other similar structures with multiple linear lines, it appears that the lines or wavering.  She has had no trauma.  She denies fever/chills, chest pain, shortness of breath, neck pain, neck stiffness, abdominal pain, dysuria.  She describes the pain as severe.  She has a history of headaches but not specifically of migraines, although her little sister suffers from frequent migraines.   Past Medical History:  Diagnosis Date  . Chickenpox   . GERD (gastroesophageal reflux disease)   . Peptic ulcer   . Tobacco abuse     Patient Active Problem List   Diagnosis Date Noted  . Preventative health care 09/29/2015  . Numbness and tingling of right upper extremity 06/09/2015  . ALLERGIC RHINITIS, SEASONAL 03/06/2008  . TOBACCO ABUSE 02/14/2008    Past Surgical History:  Procedure Laterality Date  . TONSILLECTOMY AND ADENOIDECTOMY  1993    Prior to Admission medications   Medication Sig Start Date End Date Taking? Authorizing Provider  albuterol (PROVENTIL HFA;VENTOLIN HFA) 108 (90 Base) MCG/ACT inhaler Inhale 2 puffs into the lungs every 6 (six) hours as needed for wheezing or shortness of breath. 10/20/15   [name], NP  amitriptyline (ELAVIL) 100 MG tablet  Take 1 tablet (100 mg total) by mouth at bedtime. 11/29/16   Copland, Frederico Hamman, MD  fluconazole (DIFLUCAN) 150 MG tablet 2 tabs po once now and repeat 2 tablets in 7 days 09/14/16   Owens Loffler, MD  Ibuprofen-Famotidine (DUEXIS) 800-26.6 MG TABS Take 1 by mouth tid 11/29/16   Edrick Kins, DPM  omeprazole (PRILOSEC) 20 MG capsule Take 1 capsule (20 mg total) by mouth 2 (two) times daily before a meal. 05/20/15   [name], NP  traMADol (ULTRAM) 50 MG tablet Take 1 tablet (50 mg total) by mouth every 6 (six) hours as needed. for pain 11/29/16   Copland, Frederico Hamman, MD  TRI-SPRINTEC 0.18/0.215/0.25 MG-35 MCG tablet TAKE 1 TABLET BY MOUTH DAILY. 07/17/16   [name], NP  triamcinolone cream (KENALOG) 0.1 % Apply 1 application topically 2 (two) times daily. 07/10/16   Copland, Frederico Hamman, MD    Allergies Doxycycline  Family History  Problem Relation Age of Onset  . Alcohol abuse Paternal Grandfather   . Mental illness Paternal Grandfather   . Arthritis Mother   . Heart disease Mother   . Diabetes Mother   . Hypertension Father   . Arthritis Maternal Aunt   . Breast cancer Maternal Aunt   . Alcohol abuse Paternal Uncle   . Mental illness Maternal Grandmother   . Mental illness Maternal Grandfather   . Colon cancer Paternal Grandmother   . Mental illness Paternal Grandmother     Social History Social History  Substance Use Topics  .  Smoking status: Current Every Day Smoker    Packs/day: 1.00    Types: Cigarettes  . Smokeless tobacco: Never Used  . Alcohol use 1.8 oz/week    3 Cans of beer per week    Review of Systems Constitutional: No fever/chills Eyes: Pulsating bright lights and wavering lines.  Photophobia ENT: No sore throat. Cardiovascular: Denies chest pain. Respiratory: Denies shortness of breath. Gastrointestinal: No abdominal pain.  Nausea, no vomiting.  No diarrhea.  No constipation. Genitourinary: Negative for dysuria. Musculoskeletal: Negative for neck  pain.  Negative for back pain. Integumentary: Negative for rash. Neurological: Negative for headaches, focal weakness or numbness.   ____________________________________________   PHYSICAL EXAM:  VITAL SIGNS: ED Triage Vitals  Enc Vitals Group     BP 12/14/16 1359 (!) 128/97     Pulse Rate 12/14/16 1359 96     Resp 12/14/16 1359 18     Temp 12/14/16 1359 97.7 F (36.5 C)     Temp Source 12/14/16 1359 Oral     SpO2 12/14/16 1359 100 %     Weight 12/14/16 1400 86.2 kg (190 lb)     Height 12/14/16 1400 1.6 m (5\' 3" )     Head Circumference --      Peak Flow --      Pain Score 12/14/16 1359 8     Pain Loc --      Pain Edu? --      Excl. in [place]? --     Constitutional: Alert and oriented. Well appearing and in no acute distress. Eyes: Conjunctivae are normal. PERRL. EOMI. Head: Atraumatic. Nose: No congestion/rhinnorhea. Mouth/Throat: Mucous membranes are moist. Neck: No stridor.  No meningeal signs.   Cardiovascular: Normal rate, regular rhythm. Good peripheral circulation. Grossly normal heart sounds. Respiratory: Normal respiratory effort.  No retractions. Lungs CTAB. Gastrointestinal: Soft and nontender. No distention.  Musculoskeletal: No lower extremity tenderness nor edema. No gross deformities of extremities. Neurologic:  Normal speech and language. No gross focal neurologic deficits are appreciated.  Skin:  Skin is warm, dry and intact. No rash noted. Psychiatric: Mood and affect are normal. Speech and behavior are normal.  ____________________________________________   LABS (all labs ordered are listed, but only abnormal results are displayed)  Labs Reviewed  POC URINE PREG, ED  POCT PREGNANCY, URINE   ____________________________________________  EKG  None - EKG not ordered by ED physician ____________________________________________  RADIOLOGY   No results found.  ____________________________________________   PROCEDURES  Critical Care  performed: No   Procedure(s) performed:   Procedures   ____________________________________________   INITIAL IMPRESSION / ASSESSMENT AND PLAN / ED COURSE  Pertinent labs & imaging results that were available during my care of the patient were reviewed by me and considered in my medical decision making (see chart for details).  The patient's signs and symptoms most consistent with migraine with aura.  Differential includes all acute and emergent intracranial issues including but not limited to tumor, pseudotumor cerebri, SAH, etc., but the patient is well-appearing with normal vital signs and no focal neurological deficits.  I feel that empiric treatment for migraine would be most appropriate and if this is not successful we can consider imaging.  I explained all this to her and her father and they agree with the plan.   Clinical Course as of Dec 14 1724  Thu Dec 14, 2016  1716 Patient is awake and oriented, feels a little bit sleepy but states she feels much much better and wants to leave,  get something to eat, and go to sleep.  All this is reassuring and supports my diagnosis of migraine.  I gave my usual and customary return precautions.     [CF]    Clinical Course User Index [CF] Hinda Kehr, MD    ____________________________________________  FINAL CLINICAL IMPRESSION(S) / ED DIAGNOSES  Final diagnoses:  Migraine with aura and without status migrainosus, not intractable     MEDICATIONS GIVEN DURING THIS VISIT:  Medications  sodium chloride 0.9 % bolus 500 mL (500 mLs Intravenous New Bag/Given 12/14/16 1610)  ketorolac (TORADOL) 30 MG/ML injection 15 mg (15 mg Intravenous Given 12/14/16 1617)  dexamethasone (DECADRON) injection 10 mg (10 mg Intravenous Given 12/14/16 1614)  magnesium sulfate IVPB 2 g 50 mL (2 g Intravenous New Bag/Given 12/14/16 1610)  metoCLOPramide (REGLAN) injection 10 mg (10 mg Intravenous Given 12/14/16 1613)  diphenhydrAMINE (BENADRYL) injection  12.5 mg (12.5 mg Intravenous Given 12/14/16 1616)     NEW OUTPATIENT MEDICATIONS STARTED DURING THIS VISIT:  New Prescriptions   No medications on file    Modified Medications   No medications on file    Discontinued Medications   No medications on file     Note:  This document was prepared using Dragon voice recognition software and may include unintentional dictation errors.    Hinda Kehr, MD 12/14/16 1726

## 2016-12-14 NOTE — Telephone Encounter (Signed)
Message left for patient to return my call.  

## 2016-12-14 NOTE — ED Triage Notes (Addendum)
Pt reports headache began two days ago. Pt reports associated nausea and sensitivity to light that began after the headache. Pt alert and oriented in triage. No apparent distress noted. Denies history of migraines.

## 2016-12-14 NOTE — Discharge Instructions (Signed)

## 2016-12-14 NOTE — Telephone Encounter (Signed)
Please call and check on patient, can we get her in with someone Friday to evaluate?

## 2016-12-15 NOTE — Telephone Encounter (Signed)
Patient was seen in the ED on 12/14/2016

## 2016-12-26 ENCOUNTER — Ambulatory Visit: Payer: BLUE CROSS/BLUE SHIELD | Admitting: Podiatry

## 2016-12-28 ENCOUNTER — Other Ambulatory Visit: Payer: Self-pay | Admitting: Primary Care

## 2016-12-28 DIAGNOSIS — Z789 Other specified health status: Secondary | ICD-10-CM

## 2017-01-05 ENCOUNTER — Ambulatory Visit: Payer: BLUE CROSS/BLUE SHIELD | Admitting: Podiatry

## 2017-01-09 ENCOUNTER — Ambulatory Visit (INDEPENDENT_AMBULATORY_CARE_PROVIDER_SITE_OTHER): Payer: BLUE CROSS/BLUE SHIELD | Admitting: Podiatry

## 2017-01-09 DIAGNOSIS — M722 Plantar fascial fibromatosis: Secondary | ICD-10-CM

## 2017-01-09 DIAGNOSIS — L299 Pruritus, unspecified: Secondary | ICD-10-CM

## 2017-01-09 MED ORDER — CLOBETASOL PROPIONATE 0.05 % EX OINT: 1.0000 "application " | TOPICAL_OINTMENT | Freq: Two times a day (BID) | CUTANEOUS | 0 refills | Status: DC

## 2017-01-12 NOTE — Progress Notes (Signed)
   Subjective: Patient presents today for follow-up treatment and evaluation of plantar fasciitis to the right foot. Patient states she is doing much better with only mild tenderness to the medial side of the heel. She has new walking shoes which help alleviate the pain. She is here for further evaluation and treatment.  Past Medical History:  Diagnosis Date  . Chickenpox   . GERD (gastroesophageal reflux disease)   . Peptic ulcer   . Tobacco abuse      Objective: Physical Exam General: The patient is alert and oriented x3 in no acute distress.  Dermatology: Skin is warm, dry and supple bilateral lower extremities. Negative for open lesions or macerations bilateral.   Vascular: Dorsalis Pedis and Posterior Tibial pulses palpable bilateral.  Capillary fill time is immediate to all digits.  Neurological: Epicritic and protective threshold intact bilateral.   Musculoskeletal: Tenderness to palpation at the medial calcaneal tubercale and through the insertion of the plantar fascia of the right foot. All other joints range of motion within normal limits bilateral. Strength 5/5 in all groups bilateral.    Assessment: 1. Plantar fasciitis right-Resolved 2. Pruritus right medial foot  Plan of Care:  1. Patient evaluated.    2. Prescription for Temovate cream given to patient. 3. Continue NB shoes and plantar fascial brace. 4. Return to clinic when necessary.  Edrick Kins, DPM [facility]  Dr. Edrick Kins, DPM    [address], [place] [address]                Office [phone]  Fax [phone]

## 2017-02-04 ENCOUNTER — Encounter: Payer: Self-pay | Admitting: Primary Care

## 2017-02-04 DIAGNOSIS — J302 Other seasonal allergic rhinitis: Secondary | ICD-10-CM

## 2017-02-05 MED ORDER — ALBUTEROL SULFATE HFA 108 (90 BASE) MCG/ACT IN AERS: 2.0000 | INHALATION_SPRAY | RESPIRATORY_TRACT | 0 refills | Status: DC | PRN

## 2017-06-05 ENCOUNTER — Encounter: Payer: Self-pay | Admitting: Family Medicine

## 2017-06-05 ENCOUNTER — Encounter: Payer: Self-pay | Admitting: Primary Care

## 2017-06-05 ENCOUNTER — Other Ambulatory Visit: Payer: Self-pay | Admitting: Family Medicine

## 2017-06-05 DIAGNOSIS — J302 Other seasonal allergic rhinitis: Secondary | ICD-10-CM

## 2017-06-05 MED ORDER — TRAMADOL HCL 50 MG PO TABS: 50.0000 mg | ORAL_TABLET | Freq: Four times a day (QID) | ORAL | 3 refills | Status: DC | PRN

## 2017-06-05 MED ORDER — ALBUTEROL SULFATE HFA 108 (90 BASE) MCG/ACT IN AERS: 2.0000 | INHALATION_SPRAY | RESPIRATORY_TRACT | 0 refills | Status: DC | PRN

## 2017-06-30 ENCOUNTER — Other Ambulatory Visit: Payer: Self-pay | Admitting: Primary Care

## 2017-06-30 DIAGNOSIS — Z789 Other specified health status: Secondary | ICD-10-CM

## 2017-07-09 ENCOUNTER — Encounter: Payer: Self-pay | Admitting: Primary Care

## 2017-07-09 ENCOUNTER — Ambulatory Visit (INDEPENDENT_AMBULATORY_CARE_PROVIDER_SITE_OTHER): Payer: BLUE CROSS/BLUE SHIELD | Admitting: Primary Care

## 2017-07-09 VITALS — BP 114/68 | HR 96 | Temp 98.1°F | Ht 64.0 in | Wt 218.5 lb

## 2017-07-09 DIAGNOSIS — G8929 Other chronic pain: Secondary | ICD-10-CM | POA: Insufficient documentation

## 2017-07-09 DIAGNOSIS — M25511 Pain in right shoulder: Secondary | ICD-10-CM | POA: Insufficient documentation

## 2017-07-09 DIAGNOSIS — Z0000 Encounter for general adult medical examination without abnormal findings: Secondary | ICD-10-CM

## 2017-07-09 DIAGNOSIS — K219 Gastro-esophageal reflux disease without esophagitis: Secondary | ICD-10-CM

## 2017-07-09 DIAGNOSIS — M79671 Pain in right foot: Secondary | ICD-10-CM | POA: Diagnosis not present

## 2017-07-09 DIAGNOSIS — F17200 Nicotine dependence, unspecified, uncomplicated: Secondary | ICD-10-CM

## 2017-07-09 DIAGNOSIS — M79673 Pain in unspecified foot: Secondary | ICD-10-CM

## 2017-07-09 NOTE — Assessment & Plan Note (Addendum)
Managed by Sports Medicine. Using Tramadol during the work week, using Amitriptyline nightly. Using Tylenol in between for breakthrough pain. Discussed to use Tramadol sparingly, discussed risks for addiction.

## 2017-07-09 NOTE — Progress Notes (Signed)
Subjective:    Patient ID: Bonnie Mathews, female    DOB: 05/17/1985, 32 y.o.   MRN: [id number]  HPI  Bonnie Mathews is a 32 year old female who presents today for complete physical.  Immunizations: -Tetanus: Completed in 2017 -Influenza: Did not receive last season    Diet: She endorses a poor diet. Breakfast: Yogurt, quick break containers Lunch: Frozen meals Dinner: Sandwiches, meat, starch, vegetable  Snacks: Cottage cheese, yogurt Desserts: Occasionally  Beverages: Water, coffee, Red Bull  Exercise: She is not currently exercising Eye exam: Completed in February 2019 Dental exam: Completes annually Pap Smear: Completed in 2017   Review of Systems  Constitutional: Negative for unexpected weight change.  HENT: Negative for rhinorrhea.   Respiratory: Negative for cough and shortness of breath.   Cardiovascular: Negative for chest pain.  Gastrointestinal: Negative for constipation and diarrhea.  Genitourinary: Negative for difficulty urinating and menstrual problem.  Musculoskeletal: Positive for arthralgias. Negative for myalgias.       Chronic foot and shoulder pain  Skin: Negative for rash.  Allergic/Immunologic: Negative for environmental allergies.  Neurological: Negative for dizziness, numbness and headaches.  Psychiatric/Behavioral: The patient is not nervous/anxious.        Past Medical History:  Diagnosis Date  . Chickenpox   . GERD (gastroesophageal reflux disease)   . Peptic ulcer   . Tobacco abuse      Social History   Socioeconomic History  . Marital status: Single    Spouse name: Not on file  . Number of children: Not on file  . Years of education: Not on file  . Highest education level: Not on file  Social Needs  . Financial resource strain: Not on file  . Food insecurity - worry: Not on file  . Food insecurity - inability: Not on file  . Transportation needs - medical: Not on file  . Transportation needs - non-medical: Not on file    Occupational History  . Not on file  Tobacco Use  . Smoking status: Current Every Day Smoker    Packs/day: 1.00    Types: Cigarettes  . Smokeless tobacco: Never Used  Substance and Sexual Activity  . Alcohol use: Yes    Alcohol/week: 1.8 oz    Types: 3 Cans of beer per week  . Drug use: No  . Sexual activity: Not on file  Other Topics Concern  . Not on file  Social History Narrative   Single.   1 child.   Works as a Physiological scientist   Enjoys working out and watching TV.   She's working on her GED.     Past Surgical History:  Procedure Laterality Date  . TONSILLECTOMY AND ADENOIDECTOMY  1993    Family History  Problem Relation Age of Onset  . Alcohol abuse Paternal Grandfather   . Mental illness Paternal Grandfather   . Arthritis Mother   . Heart disease Mother   . Diabetes Mother   . Hypertension Father   . Arthritis Maternal Aunt   . Breast cancer Maternal Aunt   . Alcohol abuse Paternal Uncle   . Mental illness Maternal Grandmother   . Mental illness Maternal Grandfather   . Colon cancer Paternal Grandmother   . Mental illness Paternal Grandmother     Allergies  Allergen Reactions  . Doxycycline Shortness Of Breath    Current Outpatient Medications on File Prior to Visit  Medication Sig Dispense Refill  . albuterol (PROVENTIL HFA;VENTOLIN HFA) 108 (90 Base) MCG/ACT inhaler  Inhale 2 puffs into the lungs every 4 (four) hours as needed for wheezing or shortness of breath. 1 Inhaler 0  . amitriptyline (ELAVIL) 100 MG tablet Take 1 tablet (100 mg total) by mouth at bedtime. 90 tablet 1  . clobetasol ointment (TEMOVATE) 2.95 % Apply 1 application topically 2 (two) times daily. 30 g 0  . fluconazole (DIFLUCAN) 150 MG tablet 2 tabs po once now and repeat 2 tablets in 7 days 4 tablet 1  . Ibuprofen-Famotidine (DUEXIS) 800-26.6 MG TABS Take 1 by mouth tid 90 tablet 1  . omeprazole (PRILOSEC) 20 MG capsule Take 1 capsule (20 mg total) by mouth 2 (two) times daily before a  meal. 20 capsule 0  . traMADol (ULTRAM) 50 MG tablet Take 1 tablet (50 mg total) by mouth every 6 (six) hours as needed. for pain 50 tablet 3  . TRI-SPRINTEC 0.18/0.215/0.25 MG-35 MCG tablet Take 1 tablet by mouth daily. NEED PHYSICAL APPOINTMENT FOR ANY MORE REFILLS 84 tablet 0  . triamcinolone cream (KENALOG) 0.1 % Apply 1 application topically 2 (two) times daily. 454 g 0   Current Facility-Administered Medications on File Prior to Visit  Medication Dose Route Frequency Provider Last Rate Last Dose  . betamethasone acetate-betamethasone sodium phosphate (CELESTONE) injection 3 mg  3 mg Intramuscular Once Daylene Katayama M, DPM      . betamethasone acetate-betamethasone sodium phosphate (CELESTONE) injection 3 mg  3 mg Intramuscular Once Evans, Brent M, DPM        BP 114/68   Pulse 96   Temp 98.1 F (36.7 C) (Oral)   Ht 5\' 4"  (1.626 m)   Wt 218 lb 8 oz (99.1 kg)   LMP 06/24/2017   SpO2 98%   BMI 37.51 kg/m    Objective:   Physical Exam  Constitutional: She is oriented to person, place, and time. She appears well-nourished.  HENT:  Right Ear: Tympanic membrane and ear canal normal.  Left Ear: Tympanic membrane and ear canal normal.  Nose: Nose normal.  Mouth/Throat: Oropharynx is clear and moist.  Eyes: Conjunctivae and EOM are normal. Pupils are equal, round, and reactive to light.  Neck: Neck supple. No thyromegaly present.  Cardiovascular: Normal rate and regular rhythm.  No murmur heard. Pulmonary/Chest: Effort normal and breath sounds normal. She has no rales.  Abdominal: Soft. Bowel sounds are normal. There is no tenderness.  Musculoskeletal: Normal range of motion.  Lymphadenopathy:    She has no cervical adenopathy.  Neurological: She is alert and oriented to person, place, and time. She has normal reflexes. No cranial nerve deficit.  Skin: Skin is warm and dry. No rash noted.  Psychiatric: She has a normal mood and affect.          Assessment & Plan:

## 2017-07-09 NOTE — Assessment & Plan Note (Signed)
Using omeprazole PRN, working on weight loss.

## 2017-07-09 NOTE — Assessment & Plan Note (Signed)
Td UTD. Pap smear UTD, due in 2020. Discussed the importance of a healthy diet and regular exercise in order for weight loss, and to reduce the risk of any potential medical problems. Exam unremarkable. Labs pending. Follow up in 1 year.

## 2017-07-09 NOTE — Assessment & Plan Note (Signed)
Using albuterol HS PRN for cough. Discussed importance of tobacco cessation.

## 2017-07-09 NOTE — Assessment & Plan Note (Addendum)
Located to right foot, following with Podiatry and receiving injections.

## 2017-07-09 NOTE — Patient Instructions (Addendum)
Stop by the lab prior to leaving today. I will notify you of your results once received.   Start exercising. You should be getting 150 minutes of moderate intensity exercise weekly.  Increase vegetables, fruit, whole grains, lean protein. Cottage cheese, yogurt, fruit are fine! Limit fast/fried/fatty food.  Ensure you are consuming 64 ounces of water daily.  Limit use of the Tramadol as this can become addicting.  Stop smoking as discussed. Try the nicotine patches.   It was a pleasure to see you today!

## 2017-07-10 LAB — COMPREHENSIVE METABOLIC PANEL
ALT: 15 U/L (ref 0–35)
AST: 13 U/L (ref 0–37)
Albumin: 4.2 g/dL (ref 3.5–5.2)
Alkaline Phosphatase: 61 U/L (ref 39–117)
BUN: 12 mg/dL (ref 6–23)
CHLORIDE: 103 meq/L (ref 96–112)
CO2: 28 meq/L (ref 19–32)
CREATININE: 0.62 mg/dL (ref 0.40–1.20)
Calcium: 9.5 mg/dL (ref 8.4–10.5)
GFR: 118.98 mL/min (ref >60.00)
Glucose, Bld: 92 mg/dL (ref 70–99)
Potassium: 4.1 mEq/L (ref 3.5–5.1)
SODIUM: 138 meq/L (ref 135–145)
Total Bilirubin: 0.3 mg/dL (ref 0.2–1.2)
Total Protein: 7.2 g/dL (ref 6.0–8.3)

## 2017-07-10 LAB — LIPID PANEL
CHOL/HDL RATIO: 2
Cholesterol: 158 mg/dL (ref 0–200)
HDL: 79 mg/dL (ref >39.00)
LDL CALC: 56 mg/dL (ref 0–99)
NONHDL: 79.47
Triglycerides: 118 mg/dL (ref 0.0–149.0)
VLDL: 23.6 mg/dL (ref 0.0–40.0)

## 2017-07-10 LAB — HEMOGLOBIN A1C: Hgb A1c MFr Bld: 5.3 % (ref 4.6–6.5)

## 2017-07-24 ENCOUNTER — Encounter: Payer: Self-pay | Admitting: Podiatry

## 2017-07-24 ENCOUNTER — Ambulatory Visit: Payer: BLUE CROSS/BLUE SHIELD | Admitting: Podiatry

## 2017-07-24 DIAGNOSIS — M722 Plantar fascial fibromatosis: Secondary | ICD-10-CM | POA: Diagnosis not present

## 2017-07-26 NOTE — Progress Notes (Signed)
   Subjective: Patient presents today for pain and tenderness in the right heel that began about 3.5 months ago. She states that it hurts in the mornings with the first steps out of bed. She reports some relief after receiving injections for treatment in the past. Walking and standing for long periods of time make the pain worse. Patient presents today for further treatment and evaluation.   Past Medical History:  Diagnosis Date  . Chickenpox   . GERD (gastroesophageal reflux disease)   . Peptic ulcer   . Tobacco abuse      Objective: Physical Exam General: The patient is alert and oriented x3 in no acute distress.  Dermatology: Skin is warm, dry and supple bilateral lower extremities. Negative for open lesions or macerations bilateral.   Vascular: Dorsalis Pedis and Posterior Tibial pulses palpable bilateral.  Capillary fill time is immediate to all digits.  Neurological: Epicritic and protective threshold intact bilateral.   Musculoskeletal: Tenderness to palpation to the plantar aspect of the right heel along the plantar fascia. All other joints range of motion within normal limits bilateral. Strength 5/5 in all groups bilateral.   Assessment: 1. Plantar fasciitis right 2. Pain in right foot  Plan of Care:  1. Patient evaluated.  2. Injection of 0.5cc Celestone soluspan injected into the right plantar fascia  3. Continue wearing the plantar fascial brace and taking Duexis as needed.  4. Appointment with Liliane Channel for custom molded orthotics.  5. Return to clinic in 8 weeks.   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      Edrick Kins, DPM [facility]  Dr. Edrick Kins, DPM    [address], [place] [address]                Office [phone]  Fax [phone]

## 2017-08-01 ENCOUNTER — Ambulatory Visit (INDEPENDENT_AMBULATORY_CARE_PROVIDER_SITE_OTHER): Payer: BLUE CROSS/BLUE SHIELD | Admitting: Orthotics

## 2017-08-01 DIAGNOSIS — G5791 Unspecified mononeuropathy of right lower limb: Secondary | ICD-10-CM

## 2017-08-01 DIAGNOSIS — M722 Plantar fascial fibromatosis: Secondary | ICD-10-CM | POA: Diagnosis not present

## 2017-08-01 NOTE — Progress Notes (Signed)

## 2017-08-22 ENCOUNTER — Ambulatory Visit: Payer: BLUE CROSS/BLUE SHIELD | Admitting: Orthotics

## 2017-08-22 DIAGNOSIS — M722 Plantar fascial fibromatosis: Secondary | ICD-10-CM

## 2017-08-22 NOTE — Progress Notes (Signed)
Patient came in today to pick up custom made foot orthotics.  The goals were accomplished and the patient reported no dissatisfaction with said orthotics.  Patient was advised of breakin period and how to report any issues. 

## 2017-09-18 ENCOUNTER — Encounter: Payer: Self-pay | Admitting: Podiatry

## 2017-09-18 ENCOUNTER — Ambulatory Visit: Payer: BLUE CROSS/BLUE SHIELD | Admitting: Podiatry

## 2017-09-18 DIAGNOSIS — M722 Plantar fascial fibromatosis: Secondary | ICD-10-CM | POA: Diagnosis not present

## 2017-09-19 NOTE — Progress Notes (Signed)
   Subjective: 32 year old female presenting today for follow up evaluation of plantar fasciitis of the right foot. She states the pain has improved significantly since wearing her orthotics. She has also been taking Duexis as directed. Patient is here for further evaluation and treatment.   Past Medical History:  Diagnosis Date  . Chickenpox   . GERD (gastroesophageal reflux disease)   . Peptic ulcer   . Tobacco abuse      Objective: Physical Exam General: The patient is alert and oriented x3 in no acute distress.  Dermatology: Skin is warm, dry and supple bilateral lower extremities. Negative for open lesions or macerations bilateral.   Vascular: Dorsalis Pedis and Posterior Tibial pulses palpable bilateral.  Capillary fill time is immediate to all digits.  Neurological: Epicritic and protective threshold intact bilateral.   Musculoskeletal: Tenderness to palpation to the plantar aspect of the right heel along the plantar fascia. All other joints range of motion within normal limits bilateral. Strength 5/5 in all groups bilateral.   Assessment: 1. Plantar fasciitis right - improved    Plan of Care:  1. Patient evaluated.  2. Continue taking Duexis as needed.  3. Appointment with Liliane Channel for custom molded orthotic modification.  4. Return to clinic as needed.   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      Edrick Kins, DPM [facility]  Dr. Edrick Kins, DPM    [address], [place] [address]                Office [phone]  Fax [phone]

## 2017-09-22 ENCOUNTER — Other Ambulatory Visit: Payer: Self-pay | Admitting: Primary Care

## 2017-09-22 DIAGNOSIS — Z789 Other specified health status: Secondary | ICD-10-CM

## 2017-09-24 NOTE — Telephone Encounter (Signed)
Ok to refill? Electronically refill request for TRI-SPRINTEC 0.18/0.215/0.25 MG-35 MCG tablet  Last prescribed on 07/02/2017. Last seen for CPE on 07/09/2017

## 2017-09-24 NOTE — Telephone Encounter (Signed)
Refills sent to pharmacy. 

## 2017-09-26 ENCOUNTER — Ambulatory Visit: Payer: BLUE CROSS/BLUE SHIELD | Admitting: Orthotics

## 2017-09-26 DIAGNOSIS — G5791 Unspecified mononeuropathy of right lower limb: Secondary | ICD-10-CM

## 2017-09-26 DIAGNOSIS — M722 Plantar fascial fibromatosis: Secondary | ICD-10-CM

## 2017-09-26 NOTE — Progress Notes (Signed)
Add heel punch and horsehoe cushioning.

## 2017-09-28 ENCOUNTER — Encounter: Payer: Self-pay | Admitting: Primary Care

## 2017-09-28 ENCOUNTER — Other Ambulatory Visit: Payer: Self-pay | Admitting: Primary Care

## 2017-09-28 DIAGNOSIS — J302 Other seasonal allergic rhinitis: Secondary | ICD-10-CM

## 2017-10-01 ENCOUNTER — Encounter: Payer: Self-pay | Admitting: Primary Care

## 2017-10-10 ENCOUNTER — Ambulatory Visit: Payer: BLUE CROSS/BLUE SHIELD | Admitting: Orthotics

## 2017-10-10 DIAGNOSIS — L299 Pruritus, unspecified: Secondary | ICD-10-CM

## 2017-10-10 DIAGNOSIS — G5791 Unspecified mononeuropathy of right lower limb: Secondary | ICD-10-CM

## 2017-10-10 NOTE — Progress Notes (Signed)
Patient happy with adjusted f/o heel punch.

## 2017-10-17 ENCOUNTER — Other Ambulatory Visit: Payer: Self-pay | Admitting: Family Medicine

## 2017-10-17 DIAGNOSIS — R200 Anesthesia of skin: Secondary | ICD-10-CM

## 2017-10-17 DIAGNOSIS — R202 Paresthesia of skin: Secondary | ICD-10-CM

## 2017-10-17 DIAGNOSIS — M25511 Pain in right shoulder: Principal | ICD-10-CM

## 2017-10-17 DIAGNOSIS — G8929 Other chronic pain: Secondary | ICD-10-CM

## 2017-10-17 MED ORDER — AMITRIPTYLINE HCL 100 MG PO TABS: 100.0000 mg | ORAL_TABLET | Freq: Every day | ORAL | 2 refills | Status: DC

## 2017-10-17 NOTE — Telephone Encounter (Signed)
Will communication with patient via my chart. It seems like she may have run out of medication in February this year.

## 2017-10-17 NOTE — Telephone Encounter (Signed)
Last office visit 07/09/2017.  Last refilled 11/29/2016 for #90 with 1 refill.  Ok to refill?

## 2017-12-19 ENCOUNTER — Other Ambulatory Visit: Payer: Self-pay | Admitting: Family Medicine

## 2017-12-19 NOTE — Telephone Encounter (Signed)
Copied from [place] [id number]. Topic: Quick Communication - Rx Refill/Question >> Dec 19, 2017  3:02 PM Sheran Luz wrote: Medication: traMADol (ULTRAM) 50 MG tablet [[id number]]   Pt is requesting a partial med refill until her appt on 9/9. Please advise.   Preferred Pharmacy (with phone number or street name):  [place], [place] [phone] (Phone) [phone] (Fax)

## 2017-12-19 NOTE — Telephone Encounter (Signed)
Will send to treating MD who has been following these symptoms. I prefer not to use this medication on a daily basis given the risks for addiction.

## 2017-12-19 NOTE — Telephone Encounter (Signed)
Last office visit 07/09/2017 for CPE.  Last refilled 06/05/2017 for #50 with 3 refills by Dr, Lorelei Pont. CPE scheduled  With Allie Bossier 07/12/18.  Ok to refill?

## 2017-12-20 NOTE — Telephone Encounter (Signed)
Low volume use, reasonable with chronic neck issues.

## 2017-12-31 ENCOUNTER — Encounter: Payer: Self-pay | Admitting: Family Medicine

## 2017-12-31 ENCOUNTER — Ambulatory Visit: Payer: BLUE CROSS/BLUE SHIELD | Admitting: Family Medicine

## 2017-12-31 VITALS — BP 110/72 | HR 91 | Temp 98.5°F | Ht 64.0 in | Wt 214.0 lb

## 2017-12-31 DIAGNOSIS — R200 Anesthesia of skin: Secondary | ICD-10-CM | POA: Diagnosis not present

## 2017-12-31 DIAGNOSIS — Z23 Encounter for immunization: Secondary | ICD-10-CM

## 2017-12-31 DIAGNOSIS — R202 Paresthesia of skin: Secondary | ICD-10-CM | POA: Diagnosis not present

## 2017-12-31 NOTE — Progress Notes (Signed)
Dr. Frederico Hamman T. Shandy Vi, MD, [facility] [place], [address] Phone: [phone] Fax: [phone]  12/31/2017  Patient: Bonnie Mathews, MRN: [id number], DOB: [birth year], 32 y.o.  Primary Physician:  [name], NP   Chief Complaint  Patient presents with  . Medication Refill    Tramadol   Subjective:   Bonnie Mathews is a 32 y.o. very pleasant female patient who presents with the following:  R radicular pain.  Very pleasant yet lady who I remember well.  She has had some right side of upper extremity tingling.  She has been managing this with 100 mg of Elavil at nighttime.  She is also taking some Tylenol.  She is also taking some tramadol approximately number 25 tablets/month.  Refill patterns reviewed.  Up until now, her symptoms have been relatively manageable.  She is wanting to limit more extensive work-up secondary to finances.   Elavil. ultram  Past Medical History, Surgical History, Social History, Family History, Problem List, Medications, and Allergies have been reviewed and updated if relevant.  Patient Active Problem List   Diagnosis Date Noted  . Chronic foot pain 07/09/2017  . Chronic right shoulder pain 07/09/2017  . GERD (gastroesophageal reflux disease) 07/09/2017  . Preventative health care 09/29/2015  . Numbness and tingling of right upper extremity 06/09/2015  . ALLERGIC RHINITIS, SEASONAL 03/06/2008  . TOBACCO ABUSE 02/14/2008    Past Medical History:  Diagnosis Date  . Chickenpox   . GERD (gastroesophageal reflux disease)   . Peptic ulcer   . Tobacco abuse     Past Surgical History:  Procedure Laterality Date  . TONSILLECTOMY AND ADENOIDECTOMY  1993    Social History   Socioeconomic History  . Marital status: Single    Spouse name: Not on file  . Number of children: Not on file  . Years of education: Not on file  . Highest education level: Not on file  Occupational  History  . Not on file  Social Needs  . Financial resource strain: Not on file  . Food insecurity:    Worry: Not on file    Inability: Not on file  . Transportation needs:    Medical: Not on file    Non-medical: Not on file  Tobacco Use  . Smoking status: Current Every Day Smoker    Packs/day: 1.00    Types: Cigarettes  . Smokeless tobacco: Never Used  Substance and Sexual Activity  . Alcohol use: Yes    Alcohol/week: 3.0 standard drinks    Types: 3 Cans of beer per week  . Drug use: No  . Sexual activity: Not on file  Lifestyle  . Physical activity:    Days per week: Not on file    Minutes per session: Not on file  . Stress: Not on file  Relationships  . Social connections:    Talks on phone: Not on file    Gets together: Not on file    Attends religious service: Not on file    Active member of club or organization: Not on file    Attends meetings of clubs or organizations: Not on file    Relationship status: Not on file  . Intimate partner violence:    Fear of current or ex partner: Not on file    Emotionally abused: Not on file    Physically abused: Not on file    Forced sexual activity: Not on file  Other Topics Concern  . Not on file  Social History Narrative   Single.   1 child.   Works as a Physiological scientist   Enjoys working out and watching TV.   She's working on her GED.     Family History  Problem Relation Age of Onset  . Alcohol abuse Paternal Grandfather   . Mental illness Paternal Grandfather   . Arthritis Mother   . Heart disease Mother   . Diabetes Mother   . Hypertension Father   . Arthritis Maternal Aunt   . Breast cancer Maternal Aunt   . Alcohol abuse Paternal Uncle   . Mental illness Maternal Grandmother   . Mental illness Maternal Grandfather   . Colon cancer Paternal Grandmother   . Mental illness Paternal Grandmother     Allergies  Allergen Reactions  . Doxycycline Shortness Of Breath    Medication list reviewed and updated in full  in [place].  GEN: No fevers, chills. Nontoxic. Primarily MSK c/o today. MSK: Detailed in the HPI GI: tolerating PO intake without difficulty Neuro: No numbness, parasthesias, or tingling associated. Otherwise the pertinent positives of the ROS are noted above.   Objective:   BP 110/72   Pulse 91   Temp 98.5 F (36.9 C) (Oral)   Ht 5\' 4"  (1.626 m)   Wt 214 lb (97.1 kg)   LMP 12/03/2017   BMI 36.73 kg/m    GEN: Well-developed,well-nourished,in no acute distress; alert,appropriate and cooperative throughout examination HEENT: Normocephalic and atraumatic without obvious abnormalities. Ears, externally no deformities PULM: Breathing comfortably in no respiratory distress EXT: No clubbing, cyanosis, or edema PSYCH: Normally interactive. Cooperative during the interview. Pleasant. Friendly and conversant. Not anxious or depressed appearing. Normal, full affect.  CERVICAL SPINE EXAM Range of motion: Flexion, extension, lateral bending, and rotation: full Pain with terminal motion: minimal Spinous Processes: NT SCM: NT Upper paracervical muscles: mildly tender on R Upper traps: mildly tender on the R C5-T1 intact, with motor.  Sensory - vague sensation of not quite right to soft touch on the R  Radiology: CLINICAL DATA:  Acute onset of right scapular pain with radiculopathy. Initial encounter.  EXAM: CERVICAL SPINE - COMPLETE 4+ VIEW  COMPARISON:  None.  FINDINGS: There is no evidence of fracture or subluxation. Absence of the normal lordotic curvature of the cervical spine is likely positional in nature. Vertebral bodies demonstrate normal height and alignment. Intervertebral disc spaces are preserved. Prevertebral soft tissues are within normal limits. The provided odontoid view demonstrates no significant abnormality.  The visualized lung apices are clear.  IMPRESSION: No evidence of fracture or subluxation along the cervical  spine.   Electronically Signed   By: Garald Balding M.D.   On: 06/13/2016 01:55  Assessment and Plan:   Numbness and tingling of right upper extremity  Need for prophylactic vaccination and inoculation against influenza - Plan: Flu Vaccine QUAD 6+ mos PF IM (Fluarix Quad PF)  Right upper extremity tingling, most likely of the cervical origin.  I reviewed again that potential other options for a more definitive treatment would include MRI of the cervical spine, consideration of potential spinal injection if appropriate.  At this point she feels like she is very functional and active and able to do her job without any, limitation, so she does not want to pursue this and finances are limited somewhat also.  Very reasonable to continue Elavil 100 mg even on an indefinite basis. Quite reasonable to continue with Tylenol, Aleve,  and tramadol when her symptoms are somewhat more severe.  No concerning usage patterns.  Follow-up: prn  Orders Placed This Encounter  Procedures  . Flu Vaccine QUAD 6+ mos PF IM (Fluarix Quad PF)    Signed,  Rosslyn Pasion T. Larone Kliethermes, MD   Allergies as of 12/31/2017      Reactions   Doxycycline Shortness Of Breath      Medication List        Accurate as of 12/31/17 11:59 PM. Always use your most recent med list.          amitriptyline 100 MG tablet Commonly known as:  ELAVIL Take 1 tablet (100 mg total) by mouth at bedtime.   clobetasol ointment 0.05 % Commonly known as:  TEMOVATE Apply 1 application topically 2 (two) times daily.   Ibuprofen-Famotidine 800-26.6 MG Tabs Take 1 by mouth tid   omeprazole 20 MG capsule Commonly known as:  PRILOSEC Take 1 capsule (20 mg total) by mouth 2 (two) times daily before a meal.   traMADol 50 MG tablet Commonly known as:  ULTRAM TAKE ONE TABLET BY MOUTH EVERY 6 HOURS AS NEEDED FOR PAIN   TRI-SPRINTEC 0.18/0.215/0.25 MG-35 MCG tablet Generic drug:  Norgestimate-Ethinyl Estradiol Triphasic TAKE ONE TABLET  BY MOUTH DAILY   triamcinolone cream 0.1 % Commonly known as:  KENALOG Apply 1 application topically 2 (two) times daily.   VENTOLIN HFA 108 (90 Base) MCG/ACT inhaler Generic drug:  albuterol INHALE TWO PUFFS BY MOUTH EVERY 4 HOURS AS NEEDED FOR WHEEZING OR SHORTNESS OF BREATH

## 2018-01-11 ENCOUNTER — Ambulatory Visit: Payer: BLUE CROSS/BLUE SHIELD | Admitting: Podiatry

## 2018-01-11 ENCOUNTER — Encounter: Payer: Self-pay | Admitting: Podiatry

## 2018-01-11 DIAGNOSIS — M799 Soft tissue disorder, unspecified: Secondary | ICD-10-CM | POA: Diagnosis not present

## 2018-01-11 DIAGNOSIS — M7989 Other specified soft tissue disorders: Secondary | ICD-10-CM

## 2018-01-14 ENCOUNTER — Telehealth: Payer: Self-pay

## 2018-01-14 NOTE — Telephone Encounter (Signed)
-----   Message from Edrick Kins, DPM sent at 01/11/2018 10:51 AM EDT ----- Regarding: MRI right heel Please order MRI right heel w/out contrast  Dx: soft tissue subcutaneous mass RT medial heel  [place], Dr. Amalia Hailey

## 2018-01-14 NOTE — Progress Notes (Signed)
   HPI: 32 year old female presenting today for follow up evaluation of plantar fasciitis of the right foot. She reports continued pain in the arch and heel. She notes an associated "bulge" that is painful when walking and standing. She has been using orthotics which help alleviate the pain. Touching the "bulge" increases the pain. Patient is here for further evaluation and treatment.   Past Medical History:  Diagnosis Date  . Chickenpox   . GERD (gastroesophageal reflux disease)   . Peptic ulcer   . Tobacco abuse      Physical Exam: General: The patient is alert and oriented x3 in no acute distress.  Dermatology: Skin is warm, dry and supple bilateral lower extremities. Negative for open lesions or macerations.  Vascular: Palpable pedal pulses bilaterally. No edema or erythema noted. Capillary refill within normal limits.  Neurological: Epicritic and protective threshold grossly intact bilaterally.   Musculoskeletal Exam: Fluctuant soft tissue mass within the subcutaneous tissue of the right medial heel measuring approximately 3 cm in diameter. Pain with palpation noted to the mass. Range of motion within normal limits to all pedal and ankle joints bilateral. Muscle strength 5/5 in all groups bilateral.   Assessment: 1. Soft tissue mass right medial heel   Plan of Care:  1. Patient evaluated. 2. Order for an MRI of the right heel placed today.  3. Return to clinic in 3 weeks to review MRI results.       Edrick Kins, DPM [facility]  Dr. Edrick Kins, DPM    [address],  [address]                Office [phone]  Fax [phone]

## 2018-01-14 NOTE — Telephone Encounter (Signed)
Per Jasmine Pang, MRI has been approved from 01/14/18 to 02/12/18   Patient notified via voice mail and informed now she can call to schedule appt at her convenience

## 2018-01-25 ENCOUNTER — Ambulatory Visit
Admission: RE | Admit: 2018-01-25 | Discharge: 2018-01-25 | Disposition: A | Discharge status: Home / Self Care | Payer: BLUE CROSS/BLUE SHIELD | Source: Ambulatory Visit | Attending: Podiatry | Admitting: Podiatry

## 2018-01-25 DIAGNOSIS — D1739 Benign lipomatous neoplasm of skin and subcutaneous tissue of other sites: Secondary | ICD-10-CM | POA: Diagnosis not present

## 2018-01-25 DIAGNOSIS — M7989 Other specified soft tissue disorders: Secondary | ICD-10-CM | POA: Diagnosis present

## 2018-02-01 ENCOUNTER — Encounter: Payer: Self-pay | Admitting: Podiatry

## 2018-02-01 ENCOUNTER — Ambulatory Visit: Payer: BLUE CROSS/BLUE SHIELD | Admitting: Podiatry

## 2018-02-01 ENCOUNTER — Telehealth: Payer: Self-pay | Admitting: *Deleted

## 2018-02-01 DIAGNOSIS — M7989 Other specified soft tissue disorders: Secondary | ICD-10-CM

## 2018-02-01 NOTE — Patient Instructions (Signed)
Pre-Operative Instructions  Congratulations, you have decided to take an important step towards improving your quality of life.  You can be assured that the doctors and staff at [facility] will be with you every step of the way.  Here are some important things you should know:  1. Plan to be at the surgery center/hospital at least 1 (one) hour prior to your scheduled time, unless otherwise directed by the surgical center/hospital staff.  You must have a responsible adult accompany you, remain during the surgery and drive you home.  Make sure you have directions to the surgical center/hospital to ensure you arrive on time. 2. If you are having surgery at [facility] or [facility], you will need a copy of your medical history and physical form from your family physician within one month prior to the date of surgery. We will give you a form for your primary physician to complete.  3. We make every effort to accommodate the date you request for surgery.  However, there are times where surgery dates or times have to be moved.  We will contact you as soon as possible if a change in schedule is required.   4. No aspirin/ibuprofen for one week before surgery.  If you are on aspirin, any non-steroidal anti-inflammatory medications (Mobic, Aleve, Ibuprofen) should not be taken seven (7) days prior to your surgery.  You make take Tylenol for pain prior to surgery.  5. Medications - If you are taking daily heart and blood pressure medications, seizure, reflux, allergy, asthma, anxiety, pain or diabetes medications, make sure you notify the surgery center/hospital before the day of surgery so they can tell you which medications you should take or avoid the day of surgery. 6. No food or drink after midnight the night before surgery unless directed otherwise by surgical center/hospital staff. 7. No alcoholic beverages 24-hours prior to surgery.  No smoking 24-hours prior or 24-hours after  surgery. 8. Wear loose pants or shorts. They should be loose enough to fit over bandages, boots, and casts. 9. Don't wear slip-on shoes. Sneakers are preferred. 10. Bring your boot with you to the surgery center/hospital.  Also bring crutches or a walker if your physician has prescribed it for you.  If you do not have this equipment, it will be provided for you after surgery. 11. If you have not been contacted by the surgery center/hospital by the day before your surgery, call to confirm the date and time of your surgery. 12. Leave-time from work may vary depending on the type of surgery you have.  Appropriate arrangements should be made prior to surgery with your employer. 13. Prescriptions will be provided immediately following surgery by your doctor.  Fill these as soon as possible after surgery and take the medication as directed. Pain medications will not be refilled on weekends and must be approved by the doctor. 14. Remove nail polish on the operative foot and avoid getting pedicures prior to surgery. 15. Wash the night before surgery.  The night before surgery wash the foot and leg well with water and the antibacterial soap provided. Be sure to pay special attention to beneath the toenails and in between the toes.  Wash for at least three (3) minutes. Rinse thoroughly with water and dry well with a towel.  Perform this wash unless told not to do so by your physician.  Enclosed: 1 Ice pack (please put in freezer the night before surgery)   1 Hibiclens skin cleaner     Pre-op instructions  If you have any questions regarding the instructions, please do not hesitate to call our office.  [place]: [address], [place], [place] [address] -- [phone]  [place]: [address] [place], [place] [address] -- [phone]  [place]: [address]  [place], [place] [address] -- [phone]  [place]: [address], [place], [place] [address] -- [phone]  Website:  [email or web address] 

## 2018-02-01 NOTE — Telephone Encounter (Signed)
"  Good afternoon Korianna Washer, I just got done seeing Dr. Amalia Hailey at the [facility] here in [place].  He wanted me to call to set up a surgery date.  If you, just give me a call back at your earliest convenience."

## 2018-02-04 ENCOUNTER — Encounter: Payer: Self-pay | Admitting: Podiatry

## 2018-02-04 NOTE — Telephone Encounter (Signed)
"  I left you a message on Friday.  I need to schedule my surgery."  Do you have a date in mind?  "Yes, I'd like to do it the first or second week of November."  He cal do it on November 7 or 14.  "Let's schedule if for November 14."  I'll get it scheduled.  You go online and register with the surgical center.  The link is on the front of the brochure."  I'll get it scheduled.  (I need paperwork for her, I didn't see anything in Evans' bag.)

## 2018-02-05 NOTE — Telephone Encounter (Signed)
I am calling you in regards to your surgery.   We have you scheduled for November 7 correct?  "Yes, that is correct."  You wanted to know how long you needed to be out of work?  "Yes, my boss needs to know so he will know how to work out the schedule."  Do you stand on your job?  "Yes, I do stand some.  I'm a Scientist, water quality."  You could be out of work for a couple of weeks.  "Dr. Amalia Hailey said I would be out for about two to three weeks.  I need some FMLA papers filled out."  Just bring that paperwork and give it to [facility].

## 2018-02-05 NOTE — Progress Notes (Signed)
   HPI: 32 year old female presenting today for follow up evaluation of a soft tissue mass of the right foot. She reports continued throbbing pain. Working and being on the foot all day increases the pain. Patient is here for further evaluation and treatment.   Past Medical History:  Diagnosis Date  . Chickenpox   . GERD (gastroesophageal reflux disease)   . Peptic ulcer   . Tobacco abuse      Physical Exam: General: The patient is alert and oriented x3 in no acute distress.  Dermatology: Skin is warm, dry and supple bilateral lower extremities. Negative for open lesions or macerations.  Vascular: Palpable pedal pulses bilaterally. No edema or erythema noted. Capillary refill within normal limits.  Neurological: Epicritic and protective threshold grossly intact bilaterally.   Musculoskeletal Exam: Fluctuant soft tissue mass within the subcutaneous tissue of the right medial heel measuring approximately 3 cm in diameter. Pain with palpation noted to the mass. Range of motion within normal limits to all pedal and ankle joints bilateral. Muscle strength 5/5 in all groups bilateral.   MRI Impression:  Subcutaneous lesion medial and inferior to the calcaneus is consistent with a simple lipoma. The exam is otherwise negative.  Assessment: 1. Soft tissue lipoma right    Plan of Care:  1. Patient evaluated. MRI reviewed.  2. Today we discussed the conservative versus surgical management of the presenting pathology. The patient opts for surgical management. All possible complications and details of the procedure were explained. All patient questions were answered. No guarantees were expressed or implied. 3. Authorization for surgery was initiated today. Surgery will consist of excision of soft tissue mass right foot.  4. Return to clinic one week post op.   [place] [employer], DPM [facility]  Dr. Edrick Kins, DPM    [address], [place] [address]                Office [phone]  Fax [phone]

## 2018-02-28 ENCOUNTER — Encounter: Payer: Self-pay | Admitting: Podiatry

## 2018-02-28 ENCOUNTER — Other Ambulatory Visit: Payer: Self-pay | Admitting: Podiatry

## 2018-02-28 DIAGNOSIS — M67471 Ganglion, right ankle and foot: Secondary | ICD-10-CM | POA: Diagnosis not present

## 2018-02-28 MED ORDER — OXYCODONE-ACETAMINOPHEN 5-325 MG PO TABS: 1.0000 | ORAL_TABLET | Freq: Four times a day (QID) | ORAL | 0 refills | Status: DC | PRN

## 2018-02-28 NOTE — Progress Notes (Signed)
Postop pain medication 

## 2018-03-05 ENCOUNTER — Encounter: Payer: Self-pay | Admitting: Podiatry

## 2018-03-08 ENCOUNTER — Ambulatory Visit (INDEPENDENT_AMBULATORY_CARE_PROVIDER_SITE_OTHER): Payer: BLUE CROSS/BLUE SHIELD | Admitting: Podiatry

## 2018-03-08 ENCOUNTER — Encounter: Payer: Self-pay | Admitting: Podiatry

## 2018-03-08 DIAGNOSIS — M7989 Other specified soft tissue disorders: Secondary | ICD-10-CM

## 2018-03-08 DIAGNOSIS — Z9889 Other specified postprocedural states: Secondary | ICD-10-CM

## 2018-03-11 NOTE — Progress Notes (Signed)
   Subjective:  Patient presents today status post excision of soft tissue lipoma of the right foot. DOS: 02/28/18. She states she is doing well and denies any pain. She reports associated itching of the area but denies any modifying factors. She has been using the post op shoe as directed. Patient is here for further evaluation and treatment.    Past Medical History:  Diagnosis Date  . Chickenpox   . GERD (gastroesophageal reflux disease)   . Peptic ulcer   . Tobacco abuse       Objective/Physical Exam Neurovascular status intact.  Skin incisions appear to be well coapted with sutures and staples intact. No sign of infectious process noted. No dehiscence. No active bleeding noted. Moderate edema noted to the surgical extremity.  Assessment: 1. s/p excision of soft tissue lipoma right. DOS: 02/28/17   Plan of Care:  1. Patient was evaluated. 2. Dressing changed.  3. Continue weightbearing in post op shoe.  4. Return to clinic in one week.    Edrick Kins, DPM [facility]  Dr. Edrick Kins, [address]                                        [place], [place] [address]                Office [phone]  Fax [phone]

## 2018-03-15 ENCOUNTER — Ambulatory Visit (INDEPENDENT_AMBULATORY_CARE_PROVIDER_SITE_OTHER): Payer: BLUE CROSS/BLUE SHIELD | Admitting: Podiatry

## 2018-03-15 ENCOUNTER — Encounter: Payer: Self-pay | Admitting: Podiatry

## 2018-03-15 DIAGNOSIS — G5791 Unspecified mononeuropathy of right lower limb: Secondary | ICD-10-CM

## 2018-03-15 DIAGNOSIS — Z9889 Other specified postprocedural states: Secondary | ICD-10-CM

## 2018-03-15 DIAGNOSIS — M7989 Other specified soft tissue disorders: Secondary | ICD-10-CM

## 2018-03-15 MED ORDER — SULFAMETHOXAZOLE-TRIMETHOPRIM 800-160 MG PO TABS: 1.0000 | ORAL_TABLET | Freq: Two times a day (BID) | ORAL | 0 refills | Status: DC

## 2018-03-18 NOTE — Progress Notes (Signed)
   Subjective:  Patient presents today status post excision of soft tissue lipoma of the right foot. DOS: 02/28/18. She denies any significant pain or modifying factors. She does reports some associated itching of the area. She has been using the post op shoe as directed. Patient is here for further evaluation and treatment.    Past Medical History:  Diagnosis Date  . Chickenpox   . GERD (gastroesophageal reflux disease)   . Peptic ulcer   . Tobacco abuse       Objective/Physical Exam Neurovascular status intact.  Skin incisions appear to be well coapted with sutures and staples intact. No dehiscence. No active bleeding noted. Moderate edema noted to the surgical extremity. Erythema and small drainage noted to incision site.   Assessment: 1. s/p excision of soft tissue lipoma right. DOS: 02/28/17 2. Localized cellulitis to incision site   Plan of Care:  1. Patient was evaluated. 2. Prescription for Bactrim DS #20 provided to patient.  3. Recommended Betadine to incision site daily.  4. Return to clinic in 3 weeks.    Edrick Kins, DPM [facility]  Dr. Edrick Kins, [address]                                        [place], [place] [address]                Office [phone]  Fax [phone]

## 2018-03-29 ENCOUNTER — Encounter: Payer: Self-pay | Admitting: Podiatry

## 2018-03-29 NOTE — Telephone Encounter (Signed)
I called patient regarding Mychart e-mail sent.  I informed her that from the picture attached, the incision looks really good, no redness or irritation seen.  I instructed her to keep incision with sutures clean and dry and we will take them out on Tuesday at her appt.  She verbalized understanding

## 2018-04-02 ENCOUNTER — Ambulatory Visit (INDEPENDENT_AMBULATORY_CARE_PROVIDER_SITE_OTHER): Payer: BLUE CROSS/BLUE SHIELD | Admitting: Podiatry

## 2018-04-02 ENCOUNTER — Encounter: Payer: Self-pay | Admitting: Podiatry

## 2018-04-02 DIAGNOSIS — M7989 Other specified soft tissue disorders: Secondary | ICD-10-CM

## 2018-04-02 DIAGNOSIS — Z9889 Other specified postprocedural states: Secondary | ICD-10-CM

## 2018-04-03 NOTE — Progress Notes (Signed)
   Subjective:  Patient presents today status post excision of soft tissue lipoma of the right foot. DOS: 02/28/18. She states she is doing well. She reports some mild tenderness to the incision site but denies any significant pain or modifying factors. Patient is here for further evaluation and treatment.    Past Medical History:  Diagnosis Date  . Chickenpox   . GERD (gastroesophageal reflux disease)   . Peptic ulcer   . Tobacco abuse       Objective/Physical Exam Neurovascular status intact.  Skin incisions appear to be well coapted with sutures and staples intact. No dehiscence. No active bleeding noted. Moderate edema noted to the surgical extremity.  Assessment: 1. s/p excision of soft tissue lipoma right. DOS: 02/28/17 2. Localized cellulitis to incision site - resolved    Plan of Care:  1. Patient was evaluated. 2. Sutures removed. 3. Compression anklet dispensed.  4. Expected return to work on 04/24/2018.  5. Return to clinic in 4 weeks.     Edrick Kins, DPM [facility]  Dr. Edrick Kins, [address]                                        [place], [place] [address]                Office [phone]  Fax [phone]

## 2018-04-08 NOTE — Progress Notes (Signed)
DOS  02/28/2018  Excision of soft tissue tumor right foot.

## 2018-04-16 ENCOUNTER — Encounter: Payer: Self-pay | Admitting: Internal Medicine

## 2018-04-16 ENCOUNTER — Ambulatory Visit (INDEPENDENT_AMBULATORY_CARE_PROVIDER_SITE_OTHER): Payer: BLUE CROSS/BLUE SHIELD | Admitting: Internal Medicine

## 2018-04-16 VITALS — BP 122/76 | HR 109 | Temp 98.1°F | Wt 214.0 lb

## 2018-04-16 DIAGNOSIS — J209 Acute bronchitis, unspecified: Secondary | ICD-10-CM

## 2018-04-16 DIAGNOSIS — J029 Acute pharyngitis, unspecified: Secondary | ICD-10-CM | POA: Diagnosis not present

## 2018-04-16 LAB — POCT RAPID STREP A (OFFICE): Rapid Strep A Screen: NEGATIVE

## 2018-04-16 MED ORDER — PREDNISONE 10 MG PO TABS: ORAL_TABLET | ORAL | 0 refills | Status: DC

## 2018-04-16 MED ORDER — ALBUTEROL SULFATE HFA 108 (90 BASE) MCG/ACT IN AERS: 2.0000 | INHALATION_SPRAY | Freq: Four times a day (QID) | RESPIRATORY_TRACT | 0 refills | Status: DC | PRN

## 2018-04-16 MED ORDER — AZITHROMYCIN 250 MG PO TABS: ORAL_TABLET | ORAL | 0 refills | Status: DC

## 2018-04-16 NOTE — Patient Instructions (Signed)
Sore Throat  When you have a sore throat, your throat may feel:  · Tender.  · Burning.  · Irritated.  · Scratchy.  · Painful when you swallow.  · Painful when you talk.  Many things can cause a sore throat, such as:  · An infection.  · Allergies.  · Dry air.  · Smoke or pollution.  · Radiation treatment.  · Gastroesophageal reflux disease (GERD).  · A tumor.  A sore throat can be the first sign of another sickness. It can happen with other problems, like:  · Coughing.  · Sneezing.  · Fever.  · Swelling in the neck.  Most sore throats go away without treatment.  Follow these instructions at home:         · Take over-the-counter medicines only as told by your doctor.  ? If your child has a sore throat, do not give your child aspirin.  · Drink enough fluids to keep your pee (urine) pale yellow.  · Rest when you feel you need to.  · To help with pain:  ? Sip warm liquids, such as broth, herbal tea, or warm water.  ? Eat or drink cold or frozen liquids, such as frozen ice pops.  ? Gargle with a salt-water mixture 3-4 times a day or as needed. To make a salt-water mixture, add ½-1 tsp (3-6 g) of salt to 1 cup (237 mL) of warm water. Mix it until you cannot see the salt anymore.  ? Suck on hard candy or throat lozenges.  ? Put a cool-mist humidifier in your bedroom at night.  ? Sit in the bathroom with the door closed for 5-10 minutes while you run hot water in the shower.  · Do not use any products that contain nicotine or tobacco, such as cigarettes, e-cigarettes, and chewing tobacco. If you need help quitting, ask your doctor.  · Wash your hands well and often with soap and water. If soap and water are not available, use hand sanitizer.  Contact a doctor if:  · You have a fever for more than 2-3 days.  · You keep having symptoms for more than 2-3 days.  · Your throat does not get better in 7 days.  · You have a fever and your symptoms suddenly get worse.  · Your child who is 3 months to 3 years old has a temperature of  102.2°F (39°C) or higher.  Get help right away if:  · You have trouble breathing.  · You cannot swallow fluids, soft foods, or your saliva.  · You have swelling in your throat or neck that gets worse.  · You keep feeling sick to your stomach (nauseous).  · You keep throwing up (vomiting).  Summary  · A sore throat is pain, burning, irritation, or scratchiness in the throat. Many things can cause a sore throat.  · Take over-the-counter medicines only as told by your doctor. Do not give your child aspirin.  · Drink plenty of fluids, and rest as needed.  · Contact a doctor if your symptoms get worse or your sore throat does not get better within 7 days.  This information is not intended to replace advice given to you by your health care provider. Make sure you discuss any questions you have with your health care provider.  Document Released: 01/18/2008 Document Revised: 09/10/2017 Document Reviewed: 09/10/2017  Elsevier Interactive Patient Education © 2019 [employer]

## 2018-04-16 NOTE — Addendum Note (Signed)
Addended by: Jearld Fenton on: 04/16/2018 11:41 AM   Modules accepted: Orders

## 2018-04-16 NOTE — Addendum Note (Signed)
Addended by: Lurlean Nanny on: 04/16/2018 12:18 PM   Modules accepted: Orders

## 2018-04-16 NOTE — Progress Notes (Signed)
HPI  Pt presents to the clinic today with c/o ear pain, sore throat and cough. She reports this started 1 week ago. The right ear hurts worse than the left. She describes the pain as pressure. She denies decreased hearing. She is having some difficulty swallowing. The cough is productive of brown blood tinged sputum. She denies runny nose, nasal congestion. She denies fever, chills or body aches. She has tried Sudafed and Tylenol Cold and Sinus with minimal relief. She has not had sick contacts.  Review of Systems      Past Medical History:  Diagnosis Date  . Chickenpox   . GERD (gastroesophageal reflux disease)   . Peptic ulcer   . Tobacco abuse     Family History  Problem Relation Age of Onset  . Alcohol abuse Paternal Grandfather   . Mental illness Paternal Grandfather   . Arthritis Mother   . Heart disease Mother   . Diabetes Mother   . Hypertension Father   . Arthritis Maternal Aunt   . Breast cancer Maternal Aunt   . Alcohol abuse Paternal Uncle   . Mental illness Maternal Grandmother   . Mental illness Maternal Grandfather   . Colon cancer Paternal Grandmother   . Mental illness Paternal Grandmother     Social History   Socioeconomic History  . Marital status: Single    Spouse name: Not on file  . Number of children: Not on file  . Years of education: Not on file  . Highest education level: Not on file  Occupational History  . Not on file  Social Needs  . Financial resource strain: Not on file  . Food insecurity:    Worry: Not on file    Inability: Not on file  . Transportation needs:    Medical: Not on file    Non-medical: Not on file  Tobacco Use  . Smoking status: Current Every Day Smoker    Packs/day: 1.00    Types: Cigarettes  . Smokeless tobacco: Never Used  Substance and Sexual Activity  . Alcohol use: Yes    Alcohol/week: 3.0 standard drinks    Types: 3 Cans of beer per week  . Drug use: No  . Sexual activity: Not on file  Lifestyle  .  Physical activity:    Days per week: Not on file    Minutes per session: Not on file  . Stress: Not on file  Relationships  . Social connections:    Talks on phone: Not on file    Gets together: Not on file    Attends religious service: Not on file    Active member of club or organization: Not on file    Attends meetings of clubs or organizations: Not on file    Relationship status: Not on file  . Intimate partner violence:    Fear of current or ex partner: Not on file    Emotionally abused: Not on file    Physically abused: Not on file    Forced sexual activity: Not on file  Other Topics Concern  . Not on file  Social History Narrative   Single.   1 child.   Works as a Physiological scientist   Enjoys working out and watching TV.   She's working on her GED.     Allergies  Allergen Reactions  . Doxycycline Shortness Of Breath     Constitutional:  Denies headache, fatigue, fever or abrupt weight changes.  HEENT:  Positive ear pain. Denies eye redness,  eye pain, pressure behind the eyes, facial pain, nasal congestion, ringing in the ears, wax buildup, runny nose or bloody nose. Respiratory: Positive cough. Denies difficulty breathing or shortness of breath.  Cardiovascular: Denies chest pain, chest tightness, palpitations or swelling in the hands or feet.   No other specific complaints in a complete review of systems (except as listed in HPI above).  Objective:   BP 122/76   Pulse (!) 109   Temp 98.1 F (36.7 C) (Oral)   Wt 214 lb (97.1 kg)   LMP 04/02/2018   SpO2 97%   BMI 36.73 kg/m   Wt Readings from Last 3 Encounters:  04/16/18 214 lb (97.1 kg)  12/31/17 214 lb (97.1 kg)  07/09/17 218 lb 8 oz (99.1 kg)     General: Appears her stated age, in NAD. HEENT: Head: normal shape and size; Ears: Tm's gray and intact, normal light reflex, + serous effusion bilaterally; Nose: mucosa pink and moist, septum midline; Throat/Mouth: Teeth present, mucosa erythematous and moist,  tonsils swollen R>L, with white exudate noted, no lesions or ulcerations noted.  Neck: No cervical lymphadenopathy.  Cardiovascular: Normal rate and rhythm. S1,S2 noted.  No murmur, rubs or gallops noted.  Pulmonary/Chest: Normal effort and positive vesicular breath sounds with bilateral expiratory wheezing. No respiratory distress. No rales or ronchi noted.       Assessment & Plan:   Sore Throat, Acute Bronchitis  RST: negative Get some rest and drink plenty of water Do salt water gargles for the sore throat eRx for Azithromax x 5 days eRx for Pred Taper x 6 days  RTC as needed or if symptoms persist.   Webb Silversmith, NP

## 2018-04-24 HISTORY — PX: FOOT SURGERY: SHX648

## 2018-04-30 ENCOUNTER — Ambulatory Visit (INDEPENDENT_AMBULATORY_CARE_PROVIDER_SITE_OTHER): Payer: BLUE CROSS/BLUE SHIELD | Admitting: Podiatry

## 2018-04-30 DIAGNOSIS — Z9889 Other specified postprocedural states: Secondary | ICD-10-CM

## 2018-04-30 DIAGNOSIS — M7989 Other specified soft tissue disorders: Secondary | ICD-10-CM

## 2018-05-02 NOTE — Progress Notes (Signed)
   Subjective:  Patient presents today status post excision of soft tissue lipoma of the right foot. DOS: 02/28/18. She states she is doing well and denies any significant pain or modifying factors. She reports some associated itching of the foot. Patient is here for further evaluation and treatment.    Past Medical History:  Diagnosis Date  . Chickenpox   . GERD (gastroesophageal reflux disease)   . Peptic ulcer   . Tobacco abuse       Objective/Physical Exam Neurovascular status intact.  Skin incisions appear to be well coapted. No dehiscence. No active bleeding noted. Moderate edema noted to the surgical extremity.  Assessment: 1. s/p excision of soft tissue lipoma right. DOS: 02/28/17  Plan of Care:  1. Patient was evaluated. 2. Light debridement of callus tissue along incision site performed with tissue nipper and chisel blade.  3. Recommended good shoe gear.  4. Return to clinic as needed.      Edrick Kins, DPM [facility]  Dr. Edrick Kins, [address]                                        [place], [place] [address]                Office [phone]  Fax [phone]

## 2018-06-04 ENCOUNTER — Other Ambulatory Visit: Payer: Self-pay | Admitting: Primary Care

## 2018-06-04 DIAGNOSIS — Z789 Other specified health status: Secondary | ICD-10-CM

## 2018-07-04 ENCOUNTER — Encounter: Payer: Self-pay | Admitting: Family Medicine

## 2018-07-05 MED ORDER — TRAMADOL HCL 50 MG PO TABS: 50.0000 mg | ORAL_TABLET | Freq: Four times a day (QID) | ORAL | 3 refills | Status: DC | PRN

## 2018-07-08 ENCOUNTER — Telehealth: Payer: Self-pay | Admitting: Primary Care

## 2018-07-08 NOTE — Telephone Encounter (Signed)
Called to move 3/20 appt to morning per Vallarie Mare

## 2018-07-12 ENCOUNTER — Encounter: Payer: BLUE CROSS/BLUE SHIELD | Admitting: Primary Care

## 2018-07-12 ENCOUNTER — Other Ambulatory Visit: Payer: Self-pay

## 2018-07-15 ENCOUNTER — Other Ambulatory Visit: Payer: Self-pay | Admitting: Primary Care

## 2018-07-15 DIAGNOSIS — G8929 Other chronic pain: Secondary | ICD-10-CM

## 2018-07-15 DIAGNOSIS — M25511 Pain in right shoulder: Principal | ICD-10-CM

## 2018-07-15 DIAGNOSIS — R200 Anesthesia of skin: Secondary | ICD-10-CM

## 2018-07-15 DIAGNOSIS — R202 Paresthesia of skin: Secondary | ICD-10-CM

## 2018-08-08 NOTE — Telephone Encounter (Signed)
Raquel Sarna, please schedule a virtual visit.

## 2018-08-09 NOTE — Telephone Encounter (Signed)
Lvm asking pt to call office 

## 2018-08-19 ENCOUNTER — Encounter: Payer: Self-pay | Admitting: Primary Care

## 2018-08-19 ENCOUNTER — Ambulatory Visit (INDEPENDENT_AMBULATORY_CARE_PROVIDER_SITE_OTHER): Payer: BLUE CROSS/BLUE SHIELD | Admitting: Primary Care

## 2018-08-19 ENCOUNTER — Other Ambulatory Visit: Payer: Self-pay

## 2018-08-19 VITALS — Wt 211.0 lb

## 2018-08-19 DIAGNOSIS — J301 Allergic rhinitis due to pollen: Secondary | ICD-10-CM | POA: Diagnosis not present

## 2018-08-19 MED ORDER — ALBUTEROL SULFATE HFA 108 (90 BASE) MCG/ACT IN AERS: 2.0000 | INHALATION_SPRAY | Freq: Four times a day (QID) | RESPIRATORY_TRACT | 0 refills | Status: DC | PRN

## 2018-08-19 NOTE — Assessment & Plan Note (Signed)
Doing well on albuterol inhaler and Allegra for which she predominantly uses during Spring seasonal allergy season. Appears well, no distress. Refill sent to pharmacy. Continue current regimen.

## 2018-08-19 NOTE — Progress Notes (Signed)
Subjective:    Patient ID: Bonnie Mathews, female    DOB: 07-24-1985, 33 y.o.   MRN: [id number]  HPI  Virtual Visit via Video Note  I connected with Bonnie Mathews on 08/19/18 at  8:00 AM EDT by a video enabled telemedicine application and verified that I am speaking with the correct person using two identifiers.   I discussed the limitations of evaluation and management by telemedicine and the availability of in person appointments. The patient expressed understanding and agreed to proceed. She is at home, I am in the office.  History of Present Illness:  Bonnie Mathews is a 33 year old female who presents today for medication refill and following up.  1) Seasonal Allergies: She is currently managed on albuterol HFA for which she uses it every morning during allergy season. Symptoms include cough, sneezing, and shortness of breath. She will occasionally have to use her inhaler during the day, mostly in the morning. She require use of her inhaler outside of Spring allergy season. Overall she does well with her inhaler. She is also taking Allegra every morning.   She denies fevers, chills, fatigue.   2) Chronic Shoulder Pain: Currently managed on amitriptyline 100 mg for which she takes three days weekly on average. She will take Tramadol 50 mg one to two days weekly on average. She is following with Sports Medicine and is doing home physical therapy.    Observations/Objective:  Alert and oriented. No distress. Appears well.  Speaking in complete sentences. No cough or wheezing during exam.  Assessment and Plan:  See problem based charting  Follow Up Instructions:  I sent refills of your inhaler to your pharmacy.  Try to use the Tramadol sparingly for pain as discussed.  It was a pleasure to see you today!    I discussed the assessment and treatment plan with the patient. The patient was provided an opportunity to ask questions and all were answered. The patient agreed with  the plan and demonstrated an understanding of the instructions.   The patient was advised to call back or seek an in-person evaluation if the symptoms worsen or if the condition fails to improve as anticipated.     [name], NP    Review of Systems  Constitutional: Negative for chills, fatigue and fever.  HENT: Positive for congestion. Negative for ear pain and sore throat.   Respiratory: Positive for cough and shortness of breath.   Cardiovascular: Negative for chest pain.  Allergic/Immunologic: Positive for environmental allergies.       Past Medical History:  Diagnosis Date  . Chickenpox   . GERD (gastroesophageal reflux disease)   . Peptic ulcer   . Tobacco abuse      Social History   Socioeconomic History  . Marital status: Single    Spouse name: Not on file  . Number of children: Not on file  . Years of education: Not on file  . Highest education level: Not on file  Occupational History  . Not on file  Social Needs  . Financial resource strain: Not on file  . Food insecurity:    Worry: Not on file    Inability: Not on file  . Transportation needs:    Medical: Not on file    Non-medical: Not on file  Tobacco Use  . Smoking status: Current Every Day Smoker    Packs/day: 1.00    Types: Cigarettes  . Smokeless tobacco: Never Used  Substance and Sexual  Activity  . Alcohol use: Yes    Alcohol/week: 3.0 standard drinks    Types: 3 Cans of beer per week  . Drug use: No  . Sexual activity: Not on file  Lifestyle  . Physical activity:    Days per week: Not on file    Minutes per session: Not on file  . Stress: Not on file  Relationships  . Social connections:    Talks on phone: Not on file    Gets together: Not on file    Attends religious service: Not on file    Active member of club or organization: Not on file    Attends meetings of clubs or organizations: Not on file    Relationship status: Not on file  . Intimate partner violence:     Fear of current or ex partner: Not on file    Emotionally abused: Not on file    Physically abused: Not on file    Forced sexual activity: Not on file  Other Topics Concern  . Not on file  Social History Narrative   Single.   1 child.   Works as a Physiological scientist   Enjoys working out and watching TV.   She's working on her GED.     Past Surgical History:  Procedure Laterality Date  . TONSILLECTOMY AND ADENOIDECTOMY  1993    Family History  Problem Relation Age of Onset  . Alcohol abuse Paternal Grandfather   . Mental illness Paternal Grandfather   . Arthritis Mother   . Heart disease Mother   . Diabetes Mother   . Hypertension Father   . Arthritis Maternal Aunt   . Breast cancer Maternal Aunt   . Alcohol abuse Paternal Uncle   . Mental illness Maternal Grandmother   . Mental illness Maternal Grandfather   . Colon cancer Paternal Grandmother   . Mental illness Paternal Grandmother     Allergies  Allergen Reactions  . Doxycycline Shortness Of Breath    Current Outpatient Medications on File Prior to Visit  Medication Sig Dispense Refill  . amitriptyline (ELAVIL) 100 MG tablet TAKE ONE TABLET BY MOUTH EVERY NIGHT AT BEDTIME 90 tablet 1  . TRI-SPRINTEC 0.18/0.215/0.25 MG-35 MCG tablet TAKE ONE TABLET BY MOUTH DAILY 84 tablet 1  . traMADol (ULTRAM) 50 MG tablet Take 1 tablet (50 mg total) by mouth every 6 (six) hours as needed. for pain (Patient not taking: Reported on 08/19/2018) 50 tablet 3   Current Facility-Administered Medications on File Prior to Visit  Medication Dose Route Frequency Provider Last Rate Last Dose  . betamethasone acetate-betamethasone sodium phosphate (CELESTONE) injection 3 mg  3 mg Intramuscular Once Daylene Katayama M, DPM      . betamethasone acetate-betamethasone sodium phosphate (CELESTONE) injection 3 mg  3 mg Intramuscular Once Edrick Kins, DPM        Wt 211 lb (95.7 kg)   LMP 07/21/2018   BMI 36.22 kg/m    Objective:   Physical Exam   Constitutional: She is oriented to person, place, and time. She appears well-nourished.  Respiratory: Effort normal. No respiratory distress.  Neurological: She is alert and oriented to person, place, and time.  Skin: Skin is dry.  Psychiatric: She has a normal mood and affect.           Assessment & Plan:

## 2018-08-19 NOTE — Patient Instructions (Signed)
Shortness of Breath/Wheezing/Cough: Use the albuterol inhaler. Inhale 2 puffs into the lungs every 4 to 6 hours as needed for wheezing, cough, and/or shortness of breath.   Continue Allegra once daily for allergy symptoms.  Use the Tramadol sparingly as discussed.  It was a pleasure to see you today!

## 2018-11-21 ENCOUNTER — Other Ambulatory Visit: Payer: Self-pay | Admitting: Primary Care

## 2018-11-21 DIAGNOSIS — Z789 Other specified health status: Secondary | ICD-10-CM

## 2018-12-23 DIAGNOSIS — N930 Postcoital and contact bleeding: Secondary | ICD-10-CM

## 2018-12-23 DIAGNOSIS — Z0001 Encounter for general adult medical examination with abnormal findings: Secondary | ICD-10-CM | POA: Insufficient documentation

## 2018-12-23 HISTORY — DX: Postcoital and contact bleeding: N93.0

## 2018-12-23 LAB — HM PAP SMEAR: HM Pap smear: NEGATIVE

## 2018-12-24 ENCOUNTER — Other Ambulatory Visit: Payer: Self-pay

## 2018-12-24 DIAGNOSIS — J301 Allergic rhinitis due to pollen: Secondary | ICD-10-CM

## 2018-12-24 MED ORDER — ALBUTEROL SULFATE HFA 108 (90 BASE) MCG/ACT IN AERS: 2.0000 | INHALATION_SPRAY | Freq: Four times a day (QID) | RESPIRATORY_TRACT | 0 refills | Status: DC | PRN

## 2019-01-31 ENCOUNTER — Other Ambulatory Visit: Payer: Self-pay | Admitting: Primary Care

## 2019-01-31 DIAGNOSIS — R202 Paresthesia of skin: Secondary | ICD-10-CM

## 2019-01-31 DIAGNOSIS — G8929 Other chronic pain: Secondary | ICD-10-CM

## 2019-01-31 DIAGNOSIS — R200 Anesthesia of skin: Secondary | ICD-10-CM

## 2019-03-21 ENCOUNTER — Other Ambulatory Visit: Payer: Self-pay | Admitting: Family Medicine

## 2019-03-21 ENCOUNTER — Other Ambulatory Visit: Payer: Self-pay

## 2019-03-21 DIAGNOSIS — J301 Allergic rhinitis due to pollen: Secondary | ICD-10-CM

## 2019-03-22 MED ORDER — ALBUTEROL SULFATE HFA 108 (90 BASE) MCG/ACT IN AERS: 2.0000 | INHALATION_SPRAY | Freq: Four times a day (QID) | RESPIRATORY_TRACT | 0 refills | Status: DC | PRN

## 2019-03-24 NOTE — Telephone Encounter (Signed)
Will send to prescribing MD. I do not typically refill this medication chronically.

## 2019-03-24 NOTE — Telephone Encounter (Signed)
Last office visit 08/19/2018 for Seasonal allergic rhinitis.  Last refilled 07/05/2018 for #50 with  3 refills by Dr. Lorelei Pont.  No future appointments.  Refill?

## 2019-03-24 NOTE — Telephone Encounter (Signed)
In a setting of radicular spine pain, I think this is in my opinion a good option for low risk pain control.

## 2019-05-02 ENCOUNTER — Other Ambulatory Visit: Payer: Self-pay | Admitting: Primary Care

## 2019-05-02 DIAGNOSIS — G8929 Other chronic pain: Secondary | ICD-10-CM

## 2019-05-02 DIAGNOSIS — R200 Anesthesia of skin: Secondary | ICD-10-CM

## 2019-05-02 DIAGNOSIS — M25511 Pain in right shoulder: Secondary | ICD-10-CM

## 2019-07-15 ENCOUNTER — Ambulatory Visit (INDEPENDENT_AMBULATORY_CARE_PROVIDER_SITE_OTHER): Payer: 59 | Admitting: Primary Care

## 2019-07-15 ENCOUNTER — Encounter: Payer: Self-pay | Admitting: Primary Care

## 2019-07-15 ENCOUNTER — Ambulatory Visit (INDEPENDENT_AMBULATORY_CARE_PROVIDER_SITE_OTHER)
Admission: RE | Admit: 2019-07-15 | Discharge: 2019-07-15 | Disposition: A | Discharge status: Home / Self Care | Payer: 59 | Source: Ambulatory Visit | Attending: Primary Care | Admitting: Primary Care

## 2019-07-15 ENCOUNTER — Other Ambulatory Visit: Payer: Self-pay

## 2019-07-15 VITALS — BP 118/74 | Temp 98.4°F | Wt 220.0 lb

## 2019-07-15 DIAGNOSIS — M25551 Pain in right hip: Secondary | ICD-10-CM | POA: Diagnosis not present

## 2019-07-15 DIAGNOSIS — Z789 Other specified health status: Secondary | ICD-10-CM | POA: Diagnosis not present

## 2019-07-15 DIAGNOSIS — R05 Cough: Secondary | ICD-10-CM

## 2019-07-15 DIAGNOSIS — R200 Anesthesia of skin: Secondary | ICD-10-CM

## 2019-07-15 DIAGNOSIS — R0602 Shortness of breath: Secondary | ICD-10-CM | POA: Diagnosis not present

## 2019-07-15 DIAGNOSIS — M25511 Pain in right shoulder: Secondary | ICD-10-CM

## 2019-07-15 DIAGNOSIS — Z30011 Encounter for initial prescription of contraceptive pills: Secondary | ICD-10-CM

## 2019-07-15 DIAGNOSIS — R202 Paresthesia of skin: Secondary | ICD-10-CM

## 2019-07-15 DIAGNOSIS — R053 Chronic cough: Secondary | ICD-10-CM

## 2019-07-15 DIAGNOSIS — G8929 Other chronic pain: Secondary | ICD-10-CM

## 2019-07-15 DIAGNOSIS — Z309 Encounter for contraceptive management, unspecified: Secondary | ICD-10-CM | POA: Insufficient documentation

## 2019-07-15 DIAGNOSIS — J301 Allergic rhinitis due to pollen: Secondary | ICD-10-CM

## 2019-07-15 DIAGNOSIS — F17200 Nicotine dependence, unspecified, uncomplicated: Secondary | ICD-10-CM

## 2019-07-15 HISTORY — DX: Paresthesia of skin: R20.0

## 2019-07-15 HISTORY — DX: Chronic cough: R05.3

## 2019-07-15 HISTORY — DX: Shortness of breath: R06.02

## 2019-07-15 LAB — COMPREHENSIVE METABOLIC PANEL
ALT: 33 U/L (ref 0–35)
AST: 26 U/L (ref 0–37)
Albumin: 4.5 g/dL (ref 3.5–5.2)
Alkaline Phosphatase: 82 U/L (ref 39–117)
BUN: 15 mg/dL (ref 6–23)
CO2: 29 mEq/L (ref 19–32)
Calcium: 9.6 mg/dL (ref 8.4–10.5)
Chloride: 102 mEq/L (ref 96–112)
Creatinine, Ser: 0.57 mg/dL (ref 0.40–1.20)
GFR: 121.81 mL/min (ref >60.00)
Glucose, Bld: 83 mg/dL (ref 70–99)
Potassium: 4.9 mEq/L (ref 3.5–5.1)
Sodium: 137 mEq/L (ref 135–145)
Total Bilirubin: 0.4 mg/dL (ref 0.2–1.2)
Total Protein: 7.4 g/dL (ref 6.0–8.3)

## 2019-07-15 LAB — CBC
HCT: 46.5 % — ABNORMAL HIGH (ref 36.0–46.0)
Hemoglobin: 15.7 g/dL — ABNORMAL HIGH (ref 12.0–15.0)
MCHC: 33.7 g/dL (ref 30.0–36.0)
MCV: 98.8 fl (ref 78.0–100.0)
Platelets: 248 10*3/uL (ref 150.0–400.0)
RBC: 4.71 Mil/uL (ref 3.87–5.11)
RDW: 14.5 % (ref 11.5–15.5)
WBC: 12.5 10*3/uL — ABNORMAL HIGH (ref 4.0–10.5)

## 2019-07-15 LAB — VITAMIN B12: Vitamin B-12: 725 pg/mL (ref 211–911)

## 2019-07-15 LAB — POCT URINE PREGNANCY: Preg Test, Ur: NEGATIVE

## 2019-07-15 LAB — HEMOGLOBIN A1C: Hgb A1c MFr Bld: 5.1 % (ref 4.6–6.5)

## 2019-07-15 MED ORDER — TRI-SPRINTEC 0.18/0.215/0.25 MG-35 MCG PO TABS: 1.0000 | ORAL_TABLET | Freq: Every day | ORAL | 3 refills | Status: DC

## 2019-07-15 MED ORDER — FLOVENT HFA 110 MCG/ACT IN AERO: 1.0000 | INHALATION_SPRAY | Freq: Two times a day (BID) | RESPIRATORY_TRACT | 0 refills | Status: DC

## 2019-07-15 MED ORDER — ALBUTEROL SULFATE HFA 108 (90 BASE) MCG/ACT IN AERS: 2.0000 | INHALATION_SPRAY | Freq: Four times a day (QID) | RESPIRATORY_TRACT | 0 refills | Status: DC | PRN

## 2019-07-15 NOTE — Assessment & Plan Note (Signed)
Overall improved if she avoids repetitive movement. Discouraged use of Tramadol. Use amitriptyline as needed.

## 2019-07-15 NOTE — Assessment & Plan Note (Signed)
Acute for the last 2 months, no back pain or trauma, intermittent right hip pain.   Exam today with equal strength to lower portion of extremities, did have mild difficulty fully extending right lower extremity during sitting straight leg raise.  Checking labs today including B12, A1C, CBC. Check plain films of right hip.  Consider PT.   She does have a history of right upper extremity numbness for years, now with right lower extremity numbness which is concerning. No family history of MS or neurological disorders. Need to keep in differentials list given age with numbness.

## 2019-07-15 NOTE — Assessment & Plan Note (Signed)
Smoking 1/2 PPD, encouraged cessation.

## 2019-07-15 NOTE — Patient Instructions (Addendum)
Stop by the lab and xray prior to leaving today. I will notify you of your results once received.   Stop using the albuterol inhaler daily, use only as needed.  Start fluticasone (Flovent) twice daily, everyday. Rinse your mouth after each use.  You will be contacted regarding your referral to pulmonology.  Please let us know if you have not been contacted within two weeks.   Take your first birth control pill on the Sunday after your period starts. For example, if you start your period on Monday, then take the birth control pill that following Sunday. If you start your period on Sunday, then take your first pill that same day.  You must take your pill at the same time each day.  If you miss a pill then take the pill as soon as you remember, and also take your pill at the regularly scheduled time, even if this means taking two pills in one day. If you miss more than two pills consecutively, then please call me.  It may take three to six months for birth control pills to regulate your cycle and improve symptoms.   It was a pleasure to see you today!

## 2019-07-15 NOTE — Progress Notes (Signed)
Subjective:    Patient ID: Bonnie Mathews, female    DOB: [birth year], 34 y.o.   MRN: [id number]  HPI  This visit occurred during the SARS-CoV-2 public health emergency.  Safety protocols were in place, including screening questions prior to the visit, additional usage of staff PPE, and extensive cleaning of exam room while observing appropriate contact time as indicated for disinfecting solutions.   Bonnie Mathews is a 34 year old female who presents today for follow up and medication refills.  1) Birth Control: Currently managed on Tri-Sprintec for which she's not taken in over one year. Followed with GYN through [facility] in August 2020 who put her on Loestrin FE 1/20 but she stopped this regimen two months ago due to severe cramping.  Menstrual cycles are monthly lasting 3-4 days, no menorrhagia or dysmenorrhea. She would like to resume TriSprintec. LMP was March 5th.  2) Chronic Shoulder Pain: Currently managed on amitriptyline and Tramadol as needed for breakthrough pain. Her last dose of both medications was several weeks ago.  3) Seasonal Allergies/Shortness of Breath: No formal diagnosis of asthma, but has had "bronchitis" off and on since middle school. She is a current smoker, smokes 1/2 PPD.   Symptoms include shortness of breath, cough, mucous production that occurs every morning. She will use her albuterol inhaler every morning and will find improvement in symptoms for the rest of the day. She is not taking an antihistamine.   She does have a dry cough during the day, most every day.   4) Lower Extremity Numbness: Acute for the last two months, located to the right lower extremity (starts to right hip with radiation to distal knee), intermittent occurring most days of the week. Numbness will last a few hours, no weakness. Feels like her leg will "go to sleep", occurs with rest and ambulation.   Also with intermittent right and left hip pain. Numbness occurs with right hip pain,  but she will have numbness without her hip pain. Chronic intermittent numbness to right upper extremity with repetitive movement. She denies dizziness, double vision, family history of neurological disorders.   BP Readings from Last 3 Encounters:  07/15/19 118/74  04/16/18 122/76  12/31/17 110/72     Review of Systems  HENT: Negative for congestion.   Respiratory: Positive for cough and shortness of breath.   Cardiovascular: Negative for chest pain.  Musculoskeletal: Positive for arthralgias.  Allergic/Immunologic: Positive for environmental allergies.  Neurological: Positive for numbness. Negative for weakness.       Past Medical History:  Diagnosis Date  . Chickenpox   . GERD (gastroesophageal reflux disease)   . Peptic ulcer   . Tobacco abuse      Social History   Socioeconomic History  . Marital status: Single    Spouse name: Not on file  . Number of children: Not on file  . Years of education: Not on file  . Highest education level: Not on file  Occupational History  . Not on file  Tobacco Use  . Smoking status: Current Every Day Smoker    Packs/day: 1.00    Types: Cigarettes  . Smokeless tobacco: Never Used  Substance and Sexual Activity  . Alcohol use: Yes    Alcohol/week: 3.0 standard drinks    Types: 3 Cans of beer per week  . Drug use: No  . Sexual activity: Not on file  Other Topics Concern  . Not on file  Social History Narrative   Single.  1 child.   Works as a Physiological scientist   Enjoys working out and watching TV.   She's working on her GED.    Social Determinants of Health   Financial Resource Strain:   . Difficulty of Paying Living Expenses:   Food Insecurity:   . Worried About Charity fundraiser in the Last Year:   . Arboriculturist in the Last Year:   Transportation Needs:   . Film/video editor (Medical):   Marland Kitchen Lack of Transportation (Non-Medical):   Physical Activity:   . Days of Exercise per Week:   . Minutes of Exercise per  Session:   Stress:   . Feeling of Stress :   Social Connections:   . Frequency of Communication with Friends and Family:   . Frequency of Social Gatherings with Friends and Family:   . Attends Religious Services:   . Active Member of Clubs or Organizations:   . Attends Archivist Meetings:   Marland Kitchen Marital Status:   Intimate Partner Violence:   . Fear of Current or Ex-Partner:   . Emotionally Abused:   Marland Kitchen Physically Abused:   . Sexually Abused:     Past Surgical History:  Procedure Laterality Date  . TONSILLECTOMY AND ADENOIDECTOMY  1993    Family History  Problem Relation Age of Onset  . Alcohol abuse Paternal Grandfather   . Mental illness Paternal Grandfather   . Arthritis Mother   . Heart disease Mother   . Diabetes Mother   . Hypertension Father   . Arthritis Maternal Aunt   . Breast cancer Maternal Aunt   . Alcohol abuse Paternal Uncle   . Mental illness Maternal Grandmother   . Mental illness Maternal Grandfather   . Colon cancer Paternal Grandmother   . Mental illness Paternal Grandmother     Allergies  Allergen Reactions  . Doxycycline Shortness Of Breath    Current Outpatient Medications on File Prior to Visit  Medication Sig Dispense Refill  . amitriptyline (ELAVIL) 100 MG tablet TAKE ONE TABLET BY MOUTH EVERY NIGHT AT BEDTIME (Patient not taking: Reported on 07/15/2019) 90 tablet 0  . traMADol (ULTRAM) 50 MG tablet TAKE ONE TABLET BY MOUTH EVERY 6 HOURS AS NEEDED FOR PAIN (Patient not taking: Reported on 07/15/2019) 50 tablet 2   No current facility-administered medications on file prior to visit.    BP 118/74 (BP Location: Left Arm, Patient Position: Sitting, Cuff Size: Large)   Temp 98.4 F (36.9 C) (Temporal)   Wt 220 lb (99.8 kg)   BMI 37.76 kg/m    Objective:   Physical Exam  Constitutional: She appears well-nourished.  Cardiovascular: Normal rate and regular rhythm.  Respiratory: Effort normal and breath sounds normal.    Musculoskeletal:     Cervical back: Neck supple.     Comments: 5/5 strength to bilateral lower extremities while sitting, slight weakness noted to right lower extremity during sitting straight leg raise.  Skin: Skin is warm and dry.  Psychiatric: She has a normal mood and affect.           Assessment & Plan:

## 2019-07-15 NOTE — Assessment & Plan Note (Signed)
Did well on TriSprintec previously, would like to resume. Urine pregnancy test negative today.  Discussed start date for OCP's, missed pills, etc.

## 2019-07-15 NOTE — Assessment & Plan Note (Signed)
Could be secondary to undiagnosed lung disorder, smoking, GERD, allergies.  Start with pulmonology referral for PFT's. Rx for Flovent inhaler provided. Discussed use of antihistamine nightly.

## 2019-07-15 NOTE — Assessment & Plan Note (Signed)
Intermittent with repetitive movement at work.  Overall improved. Continue to monitor.

## 2019-07-15 NOTE — Assessment & Plan Note (Signed)
Daily with daily use of albuterol. Strongly advised she quit smoking.  Referral place to pulmonology for PFT's for formal diagnosis.  In the mean time we will start Flovent BID, discussed to use albuterol PRN.

## 2019-07-16 ENCOUNTER — Other Ambulatory Visit (INDEPENDENT_AMBULATORY_CARE_PROVIDER_SITE_OTHER): Payer: 59

## 2019-07-16 DIAGNOSIS — R053 Chronic cough: Secondary | ICD-10-CM

## 2019-07-16 DIAGNOSIS — D72829 Elevated white blood cell count, unspecified: Secondary | ICD-10-CM | POA: Diagnosis not present

## 2019-07-16 DIAGNOSIS — R05 Cough: Secondary | ICD-10-CM

## 2019-07-16 LAB — WHITE CELL DIFFERENTIAL
Band Neutrophils: 0 %
Basophils Relative: 0.7 % (ref 0.0–3.0)
Eosinophils Relative: 3.2 % (ref 0.0–5.0)
Lymphocytes Relative: 10.3 % — ABNORMAL LOW (ref 12.0–46.0)
Monocytes Relative: 6.7 % (ref 3.0–12.0)
Neutrophils Relative %: 79.1 % — ABNORMAL HIGH (ref 43.0–77.0)

## 2019-07-24 ENCOUNTER — Ambulatory Visit (INDEPENDENT_AMBULATORY_CARE_PROVIDER_SITE_OTHER)
Admission: RE | Admit: 2019-07-24 | Discharge: 2019-07-24 | Disposition: A | Discharge status: Home / Self Care | Payer: 59 | Source: Ambulatory Visit | Attending: Primary Care | Admitting: Primary Care

## 2019-07-24 DIAGNOSIS — R053 Chronic cough: Secondary | ICD-10-CM

## 2019-07-24 DIAGNOSIS — R05 Cough: Secondary | ICD-10-CM

## 2019-07-27 NOTE — Progress Notes (Signed)
Seriah Brotzman T. Ada Holness, MD Primary Care and Sports Medicine [facility] at [facility] [place], [address] Phone: [phone]  FAX: [place] - 34 y.o. female  MRN [id number]  Date of Birth: 05/15/1985  Visit Date: 07/28/2019  PCP: [name], NP  Referred by: [name], NP  Chief Complaint  Patient presents with  . Numbness    Right Thigh  . Hip Pain    This visit occurred during the SARS-CoV-2 public health emergency.  Safety protocols were in place, including screening questions prior to the visit, additional usage of staff PPE, and extensive cleaning of exam room while observing appropriate contact time as indicated for disinfecting solutions.   Subjective:   Bonnie Mathews is a 34 y.o. very pleasant female patient with Body mass index is 38.02 kg/m. who presents with the following:  She is a pleasant patient and she is been seen by myself a number of times for some cervical radiculopathy.  Today she does present with some numbness and tingling in one of her legs.  She is currently taking Elavil 100 mg at nighttime.  She also occasionally does use some tramadol.  In January was out of ten days, right thigh was going numb without pins and needles.  Lateral and posterior.  First noticed on her R and then on the L side.  L will hurt.    This is the right anterior and lateral thigh.  She is not having any weakness, she is having some numbness on the right.  She has gained about 30 pounds over the last 3 years.  Xray, hip series Indication: pain Findings: No occult fracture or dislocation. No signficant osteoarthritic changes.  The radiological images were independently reviewed by myself in the office and results were reviewed with the patient. My independent interpretation of images:  Above Electronically Signed  By: Owens Loffler, MD On: 07/28/2019  2:20 PM EDT   Wt Readings from Last 3  Encounters:  07/28/19 221 lb 8 oz (100.5 kg)  07/15/19 220 lb (99.8 kg)  08/19/18 211 lb (95.7 kg)     Review of Systems is noted in the HPI, as appropriate   Objective:   BP 112/78   Pulse 90   Temp 97.7 F (36.5 C) (Temporal)   Ht 5\' 4"  (1.626 m)   Wt 221 lb 8 oz (100.5 kg)   LMP 07/26/2019   SpO2 99%   BMI 38.02 kg/m   GEN: No acute distress; alert,appropriate. PULM: Breathing comfortably in no respiratory distress PSYCH: Normally interactive.    GEN: No acute distress; alert,appropriate. PULM: Breathing comfortably in no respiratory distress PSYCH: Normally interactive.   Range of motion at  the waist: Flexion: normal Extension: normal Lateral bending: normal Rotation: all normal  No echymosis or edema Rises to examination table with no difficulty Gait: non antalgic  Inspection/Deformity: N Paraspinus Tenderness: Nontender  B Ankle Dorsiflexion (L5,4): 5/5 B Great Toe Dorsiflexion (L5,4): 5/5 Heel Walk (L5): WNL Toe Walk (S1): WNL Rise/Squat (L4): WNL  SENSORY B Medial Foot (L4): WNL B Dorsum (L5): WNL B Lateral (S1): WNL Light Touch: WNL Pinprick: WNL On the lateral right thigh the patient does have some decreased sensation compared to the contralateral side.  B SLR, seated: neg B SLR, supine: neg B FABER: neg B Reverse FABER: Painful B Greater Troch: Mildly tender on the right  B Log Roll: neg B Sciatic Notch: NT  Radiology:  Assessment and Plan:     ICD-10-CM   1. Meralgia paraesthetica, right  G57.11   2. Trochanteric bursitis, right hip  M70.61    Total encounter time: 30 minutes. On the day of the patient encounter, this can include review of prior records, labs, and imaging.  Additional time can include counselling, consultation with peer MD in person or by telephone.  This also includes independent review of Radiology.  Additional time spent on chart review, reading of plain films by an additional provider, in academic review of  the case.  Classic MP, she should do fine with conservative care alone.  I gave her some handouts regarding this and reviewed some hip and psoas stretching with the patient. Rec some Youtube tutorials, too.  I appreciate the opportunity to evaluate this very friendly patient. If you have any question regarding her care or prognosis, do not hesitate to ask.   Follow-up: prn  No orders of the defined types were placed in this encounter.  There are no discontinued medications. No orders of the defined types were placed in this encounter.   Signed,  Maud Deed. Athleen Feltner, MD   Outpatient Encounter Medications as of 07/28/2019  Medication Sig  . albuterol (VENTOLIN HFA) 108 (90 Base) MCG/ACT inhaler Inhale 2 puffs into the lungs every 6 (six) hours as needed for wheezing or shortness of breath.  Marland Kitchen amitriptyline (ELAVIL) 100 MG tablet TAKE ONE TABLET BY MOUTH EVERY NIGHT AT BEDTIME  . fluticasone (FLOVENT HFA) 110 MCG/ACT inhaler Inhale 1 puff into the lungs 2 (two) times daily. Rinse mouth after use.  . traMADol (ULTRAM) 50 MG tablet TAKE ONE TABLET BY MOUTH EVERY 6 HOURS AS NEEDED FOR PAIN  . TRI-SPRINTEC 0.18/0.215/0.25 MG-35 MCG tablet Take 1 tablet by mouth daily.   No facility-administered encounter medications on file as of 07/28/2019.

## 2019-07-28 ENCOUNTER — Other Ambulatory Visit: Payer: Self-pay

## 2019-07-28 ENCOUNTER — Ambulatory Visit (INDEPENDENT_AMBULATORY_CARE_PROVIDER_SITE_OTHER): Payer: 59 | Admitting: Family Medicine

## 2019-07-28 ENCOUNTER — Encounter: Payer: Self-pay | Admitting: Family Medicine

## 2019-07-28 VITALS — BP 112/78 | HR 90 | Temp 97.7°F | Ht 64.0 in | Wt 221.5 lb

## 2019-07-28 DIAGNOSIS — M7061 Trochanteric bursitis, right hip: Secondary | ICD-10-CM

## 2019-07-28 DIAGNOSIS — G5711 Meralgia paresthetica, right lower limb: Secondary | ICD-10-CM | POA: Diagnosis not present

## 2019-07-31 ENCOUNTER — Ambulatory Visit (INDEPENDENT_AMBULATORY_CARE_PROVIDER_SITE_OTHER): Payer: 59 | Admitting: Critical Care Medicine

## 2019-07-31 ENCOUNTER — Other Ambulatory Visit: Payer: Self-pay

## 2019-07-31 ENCOUNTER — Encounter: Payer: Self-pay | Admitting: Critical Care Medicine

## 2019-07-31 VITALS — BP 122/84 | HR 108 | Temp 97.9°F | Ht 63.0 in | Wt 220.0 lb

## 2019-07-31 DIAGNOSIS — Z720 Tobacco use: Secondary | ICD-10-CM | POA: Diagnosis not present

## 2019-07-31 DIAGNOSIS — R0600 Dyspnea, unspecified: Secondary | ICD-10-CM | POA: Diagnosis not present

## 2019-07-31 DIAGNOSIS — R05 Cough: Secondary | ICD-10-CM | POA: Diagnosis not present

## 2019-07-31 DIAGNOSIS — R0602 Shortness of breath: Secondary | ICD-10-CM

## 2019-07-31 DIAGNOSIS — R0609 Other forms of dyspnea: Secondary | ICD-10-CM

## 2019-07-31 DIAGNOSIS — R053 Chronic cough: Secondary | ICD-10-CM

## 2019-07-31 MED ORDER — VARENICLINE TARTRATE 0.5 MG X 11 & 1 MG X 42 PO MISC: ORAL | 0 refills | Status: DC

## 2019-07-31 MED ORDER — VARENICLINE TARTRATE 1 MG PO TABS: 1.0000 mg | ORAL_TABLET | Freq: Two times a day (BID) | ORAL | 4 refills | Status: DC

## 2019-07-31 MED ORDER — ALBUTEROL SULFATE HFA 108 (90 BASE) MCG/ACT IN AERS: 2.0000 | INHALATION_SPRAY | RESPIRATORY_TRACT | 11 refills | Status: DC | PRN

## 2019-07-31 MED ORDER — FLOVENT HFA 110 MCG/ACT IN AERO: 1.0000 | INHALATION_SPRAY | Freq: Two times a day (BID) | RESPIRATORY_TRACT | 0 refills | Status: DC

## 2019-07-31 MED ORDER — FLUTICASONE-SALMETEROL 100-50 MCG/DOSE IN AEPB: 1.0000 | INHALATION_SPRAY | Freq: Two times a day (BID) | RESPIRATORY_TRACT | 11 refills | Status: DC

## 2019-07-31 NOTE — Progress Notes (Signed)
Synopsis: Referred in April 2021 for SOB by [name], NP.  Subjective:   PATIENT ID: Bonnie Mathews GENDER: female DOB: 04/30/1985, MRN: [id number]  Chief Complaint  Patient presents with  . Consult    Patient is here for cough that is productive in the morning with thick green/brown mucus and then gets better through out the day. She shortness of breath with exertion. History of bronchitis. Recently started on Flovent and has noticed a difference and has not had to use rescue inhaler.     Bonnie Mathews is a 34 year old woman who presents for evaluation of shortness of breath and cough.  She has had recurrent episodes of bronchitis since she was in middle school.  For the past several months she has had cough with significant discolored mucus production in the morning and shortness of breath which responds to albuterol.  She has a dry cough throughout the day and occasional wheezing.  Wearing a mask worsens her shortness of breath.  She has worse symptoms if she is around strong smells such as perfume.  She was recently started on Flovent and Zyrtec about 2 weeks ago with improvement in her symptoms.  She was using albuterol 2-3 times per day, but has not needed it since starting Flovent.  Her allergies with postnasal drip is improved since starting Zyrtec.  Her family history is notable for lung cancer in her grandmother and uncle.  She is an every day smoker-1 pack/day x 17 years.  She smokes more if she is stressed.  Her boyfriend also smokes.  She wants to quit.  She has tried nicotine replacement patches in the past and was successful for about 6 months, but has struggled being around her friends who smoke. 07/15/2019 PCP note reviewed.     Past Medical History:  Diagnosis Date  . Chickenpox   . GERD (gastroesophageal reflux disease)   . Peptic ulcer   . Tobacco abuse      Family History  Problem Relation Age of Onset  . Alcohol abuse Paternal Grandfather   . Mental illness  Paternal Grandfather   . Arthritis Mother   . Heart disease Mother   . Diabetes Mother   . Hypertension Father   . Arthritis Maternal Aunt   . Breast cancer Maternal Aunt   . Alcohol abuse Paternal Uncle   . Mental illness Maternal Grandmother   . Mental illness Maternal Grandfather   . Colon cancer Paternal Grandmother   . Mental illness Paternal Grandmother      Past Surgical History:  Procedure Laterality Date  . FOOT SURGERY Right    lipoma removed from arch  . TONSILLECTOMY AND ADENOIDECTOMY  1993    Social History   Socioeconomic History  . Marital status: Single    Spouse name: Not on file  . Number of children: Not on file  . Years of education: Not on file  . Highest education level: Not on file  Occupational History  . Not on file  Tobacco Use  . Smoking status: Current Every Day Smoker    Packs/day: 1.00    Years: 17.00    Pack years: 17.00    Types: Cigarettes  . Smokeless tobacco: Never Used  Substance and Sexual Activity  . Alcohol use: Yes    Comment: Occ  . Drug use: Yes    Types: Marijuana  . Sexual activity: Not on file  Other Topics Concern  . Not on file  Social History Narrative  Single.   1 child.   Works as a Freight forwarder at [employer]   Enjoys working out and watching TV.   She's working on her GED.    Social Determinants of Health   Financial Resource Strain:   . Difficulty of Paying Living Expenses:   Food Insecurity:   . Worried About Charity fundraiser in the Last Year:   . Arboriculturist in the Last Year:   Transportation Needs:   . Film/video editor (Medical):   Marland Kitchen Lack of Transportation (Non-Medical):   Physical Activity:   . Days of Exercise per Week:   . Minutes of Exercise per Session:   Stress:   . Feeling of Stress :   Social Connections:   . Frequency of Communication with Friends and Family:   . Frequency of Social Gatherings with Friends and Family:   . Attends Religious Services:   . Active Member of  Clubs or Organizations:   . Attends Archivist Meetings:   Marland Kitchen Marital Status:   Intimate Partner Violence:   . Fear of Current or Ex-Partner:   . Emotionally Abused:   Marland Kitchen Physically Abused:   . Sexually Abused:      Allergies  Allergen Reactions  . Doxycycline Shortness Of Breath     Immunization History  Administered Date(s) Administered  . Influenza,inj,Quad PF,6+ Mos 12/24/2015, 12/31/2017  . Td 09/29/2015    Outpatient Medications Prior to Visit  Medication Sig Dispense Refill  . amitriptyline (ELAVIL) 100 MG tablet TAKE ONE TABLET BY MOUTH EVERY NIGHT AT BEDTIME 90 tablet 0  . traMADol (ULTRAM) 50 MG tablet TAKE ONE TABLET BY MOUTH EVERY 6 HOURS AS NEEDED FOR PAIN 50 tablet 2  . TRI-SPRINTEC 0.18/0.215/0.25 MG-35 MCG tablet Take 1 tablet by mouth daily. 84 tablet 3  . albuterol (VENTOLIN HFA) 108 (90 Base) MCG/ACT inhaler Inhale 2 puffs into the lungs every 6 (six) hours as needed for wheezing or shortness of breath. 18 g 0  . fluticasone (FLOVENT HFA) 110 MCG/ACT inhaler Inhale 1 puff into the lungs 2 (two) times daily. Rinse mouth after use. 1 Inhaler 0   No facility-administered medications prior to visit.    Review of Systems  Constitutional: Negative for chills and fever.  HENT: Negative for sinus pain.        Postnasal drip resolved  Respiratory: Positive for cough, sputum production, shortness of breath and wheezing.   Cardiovascular: Negative for chest pain and leg swelling.  Gastrointestinal: Negative for heartburn, nausea and vomiting.  Endo/Heme/Allergies: Positive for environmental allergies.     Objective:   Vitals:   07/31/19 1427  BP: 122/84  Pulse: (!) 108  Temp: 97.9 F (36.6 C)  TempSrc: Temporal  SpO2: 98%  Weight: 220 lb (99.8 kg)  Height: 5\' 3"  (1.6 m)   98% on   RA BMI Readings from Last 3 Encounters:  07/31/19 38.97 kg/m  07/28/19 38.02 kg/m  07/15/19 37.76 kg/m   Wt Readings from Last 3 Encounters:  07/31/19 220 lb  (99.8 kg)  07/28/19 221 lb 8 oz (100.5 kg)  07/15/19 220 lb (99.8 kg)    Physical Exam Vitals reviewed.  Constitutional:      Appearance: Normal appearance. She is obese. She is not ill-appearing.  HENT:     Head: Normocephalic and atraumatic.  Eyes:     General: No scleral icterus. Cardiovascular:     Rate and Rhythm: Normal rate and regular rhythm.  Heart sounds: No murmur.  Pulmonary:     Comments: Breathing comfortably on room air, no conversational dyspnea.  Coughing with deep inhalation. No wheezing. Abdominal:     General: There is no distension.     Palpations: Abdomen is soft.     Tenderness: There is no abdominal tenderness.  Musculoskeletal:        General: No swelling or deformity.     Cervical back: Neck supple.  Lymphadenopathy:     Cervical: No cervical adenopathy.  Skin:    General: Skin is warm and dry.     Findings: No rash.  Neurological:     General: No focal deficit present.     Mental Status: She is alert.     Coordination: Coordination normal.  Psychiatric:        Mood and Affect: Mood normal.        Behavior: Behavior normal.      CBC    Component Value Date/Time   WBC 12.5 (H) 07/15/2019 1031   RBC 4.71 07/15/2019 1031   HGB 15.7 (H) 07/15/2019 1031   HGB 14.6 07/31/2013 2241   HCT 46.5 (H) 07/15/2019 1031   HCT 44.9 07/31/2013 2241   PLT 248.0 07/15/2019 1031   PLT 259 07/31/2013 2241   MCV 98.8 07/15/2019 1031   MCV 93 07/31/2013 2241   MCH 31.8 10/07/2014 1719   MCHC 33.7 07/15/2019 1031   RDW 14.5 07/15/2019 1031   RDW 13.4 07/31/2013 2241   LYMPHSABS 1.7 10/07/2014 1719   LYMPHSABS 2.9 07/31/2013 2241   MONOABS 0.7 10/07/2014 1719   MONOABS 0.8 07/31/2013 2241   EOSABS 0.3 10/07/2014 1719   EOSABS 0.6 07/31/2013 2241   BASOSABS 0.1 10/07/2014 1719   BASOSABS 0.1 07/31/2013 2241    CHEMISTRY No results for input(s): NA, K, CL, CO2, GLUCOSE, BUN, CREATININE, CALCIUM, MG, PHOS in the last 168 hours. Estimated  Creatinine Clearance: 112.7 mL/min (by C-G formula based on SCr of 0.57 mg/dL).   Chest Imaging- films reviewed: CXR, 2 view 4/1/ 21-normal  Pulmonary Functions Testing Results: No flowsheet data found.      Assessment & Plan:     ICD-10-CM   1. Tobacco abuse  Z72.0 Pulmonary function test  2. DOE (dyspnea on exertion)  R06.00 Pulmonary function test  3. Shortness of breath  R06.02 fluticasone (FLOVENT HFA) 110 MCG/ACT inhaler    albuterol (VENTOLIN HFA) 108 (90 Base) MCG/ACT inhaler  4. Chronic cough  R05 fluticasone (FLOVENT HFA) 110 MCG/ACT inhaler    albuterol (VENTOLIN HFA) 108 (90 Base) MCG/ACT inhaler    Likely moderate persistent asthma with chronic cough and dyspnea.  Likely allergic, previously elevated eosinophils. -PFTs -Due to cost of Flovent and incomplete symptom control, will trying to escalate therapy to Advair twice daily.  Instructed to rinse her mouth after every use. -Continue albuterol. -Strongly recommend smoking cessation. -Allergen avoidance.  Continue cetirizine daily.  Allergic rhinosinusitis -Daily cetirizine  Tobacco abuse -Discussed the benefits of quitting smoking and multiple strategies.  She would like to try Chantix.  We discussed the warning of suicidal ideations, depression, nightmares which can occur with Chantix and would be an indication to stop the medication immediately.  These should be short-term side effects that resolved after stopping the medication. -Recommended that she discuss attempting to quit smoking with her boyfriend at the same time to help support her efforts.  RTC in 2 months with PFTs.    Current Outpatient Medications:  .  albuterol (VENTOLIN HFA)  108 (90 Base) MCG/ACT inhaler, Inhale 2 puffs into the lungs every 4 (four) hours as needed for wheezing or shortness of breath., Disp: 18 g, Rfl: 11 .  amitriptyline (ELAVIL) 100 MG tablet, TAKE ONE TABLET BY MOUTH EVERY NIGHT AT BEDTIME, Disp: 90 tablet, Rfl: 0 .   fluticasone (FLOVENT HFA) 110 MCG/ACT inhaler, Inhale 1 puff into the lungs 2 (two) times daily. Rinse mouth after use., Disp: 1 Inhaler, Rfl: 0 .  traMADol (ULTRAM) 50 MG tablet, TAKE ONE TABLET BY MOUTH EVERY 6 HOURS AS NEEDED FOR PAIN, Disp: 50 tablet, Rfl: 2 .  TRI-SPRINTEC 0.18/0.215/0.25 MG-35 MCG tablet, Take 1 tablet by mouth daily., Disp: 84 tablet, Rfl: 3 .  Fluticasone-Salmeterol (ADVAIR DISKUS) 100-50 MCG/DOSE AEPB, Inhale 1 puff into the lungs 2 (two) times daily., Disp: 60 each, Rfl: 11 .  varenicline (CHANTIX CONTINUING MONTH PAK) 1 MG tablet, Take 1 tablet (1 mg total) by mouth 2 (two) times daily., Disp: 60 tablet, Rfl: 4 .  varenicline (CHANTIX PAK) 0.5 MG X 11 & 1 MG X 42 tablet, Take one 0.5 mg tablet by mouth once daily for 3 days, then increase to one 0.5 mg tablet twice daily for 4 days, then increase to one 1 mg tablet twice daily., Disp: 53 tablet, Rfl: 0     [name], DO [facility] 07/31/2019 3:07 PM

## 2019-07-31 NOTE — Patient Instructions (Addendum)
Thank you for visiting Dr. Carlis Abbott at [facility]. We recommend the following: Orders Placed This Encounter  Procedures  . Pulmonary function test   Orders Placed This Encounter  Procedures  . Pulmonary function test    Standing Status:   Future    Standing Expiration Date:   07/30/2020    Order Specific Question:   Where should this test be performed?    Answer:   [facility]    Order Specific Question:   Full PFT: includes the following: basic spirometry, spirometry pre & post bronchodilator, diffusion capacity (DLCO), lung volumes    Answer:   Full PFT    Meds ordered this encounter  Medications  . varenicline (CHANTIX PAK) 0.5 MG X 11 & 1 MG X 42 tablet    Sig: Take one 0.5 mg tablet by mouth once daily for 3 days, then increase to one 0.5 mg tablet twice daily for 4 days, then increase to one 1 mg tablet twice daily.    Dispense:  53 tablet    Refill:  0  . varenicline (CHANTIX CONTINUING MONTH PAK) 1 MG tablet    Sig: Take 1 tablet (1 mg total) by mouth 2 (two) times daily.    Dispense:  60 tablet    Refill:  4  . fluticasone (FLOVENT HFA) 110 MCG/ACT inhaler    Sig: Inhale 1 puff into the lungs 2 (two) times daily. Rinse mouth after use.    Dispense:  1 Inhaler    Refill:  0  . albuterol (VENTOLIN HFA) 108 (90 Base) MCG/ACT inhaler    Sig: Inhale 2 puffs into the lungs every 4 (four) hours as needed for wheezing or shortness of breath.    Dispense:  18 g    Refill:  11  . Fluticasone-Salmeterol (ADVAIR DISKUS) 100-50 MCG/DOSE AEPB    Sig: Inhale 1 puff into the lungs 2 (two) times daily.    Dispense:  60 each    Refill:  11    Return in about 2 months (around 09/30/2019).    Please do your part to reduce the spread of COVID-19.  It is very important that you stop smoking or vaping. This is the single most important thing that you can do to improve your lung health.   S = Set a quit date. T = Tell family, friends, and the people around you that you plan to  quit. A = Anticipate or plan ahead for the tough times you'll face while quitting. R = Remove cigarettes and other tobacco products from your home, car, and work. T = Talk to [place] about getting help to quit.  If you need help, please reach out to our office or the smoking cessation resources available: [place] Smoking Cessation Class: [id number]:5250201 1-800-QUIT-NOW www.BeTobaccoFree.gov

## 2019-08-08 ENCOUNTER — Other Ambulatory Visit: Payer: Self-pay | Admitting: Primary Care

## 2019-08-08 DIAGNOSIS — M25511 Pain in right shoulder: Secondary | ICD-10-CM

## 2019-08-08 DIAGNOSIS — G8929 Other chronic pain: Secondary | ICD-10-CM

## 2019-08-08 DIAGNOSIS — R200 Anesthesia of skin: Secondary | ICD-10-CM

## 2019-08-11 ENCOUNTER — Encounter: Payer: Self-pay | Admitting: Family Medicine

## 2019-08-11 NOTE — Telephone Encounter (Signed)
Last office visit 07/28/2019 with Dr. Lorelei Pont for numbness/hip pain.  Last refilled 03/24/2019 for #50 with 2 refills.  No future appointments with PCP or Dr. Lorelei Pont.  Ok to refill?  Anda Kraft is out of the office today.

## 2019-08-12 MED ORDER — TRAMADOL HCL 50 MG PO TABS: 50.0000 mg | ORAL_TABLET | Freq: Four times a day (QID) | ORAL | 2 refills | Status: DC | PRN

## 2019-09-26 ENCOUNTER — Other Ambulatory Visit
Admission: RE | Admit: 2019-09-26 | Discharge: 2019-09-26 | Disposition: A | Discharge status: Home / Self Care | Payer: 59 | Source: Ambulatory Visit | Attending: Primary Care | Admitting: Primary Care

## 2019-09-26 ENCOUNTER — Other Ambulatory Visit: Payer: Self-pay

## 2019-09-26 DIAGNOSIS — Z20822 Contact with and (suspected) exposure to covid-19: Secondary | ICD-10-CM | POA: Diagnosis not present

## 2019-09-26 DIAGNOSIS — Z01812 Encounter for preprocedural laboratory examination: Secondary | ICD-10-CM | POA: Diagnosis present

## 2019-09-27 LAB — SARS CORONAVIRUS 2 (TAT 6-24 HRS): SARS Coronavirus 2: NEGATIVE

## 2019-09-30 ENCOUNTER — Encounter: Payer: Self-pay | Admitting: Adult Health

## 2019-09-30 ENCOUNTER — Ambulatory Visit (INDEPENDENT_AMBULATORY_CARE_PROVIDER_SITE_OTHER): Payer: 59 | Admitting: Adult Health

## 2019-09-30 ENCOUNTER — Ambulatory Visit: Payer: 59 | Admitting: Primary Care

## 2019-09-30 ENCOUNTER — Ambulatory Visit (INDEPENDENT_AMBULATORY_CARE_PROVIDER_SITE_OTHER): Payer: 59 | Admitting: Critical Care Medicine

## 2019-09-30 ENCOUNTER — Other Ambulatory Visit: Payer: Self-pay

## 2019-09-30 DIAGNOSIS — J301 Allergic rhinitis due to pollen: Secondary | ICD-10-CM

## 2019-09-30 DIAGNOSIS — R0600 Dyspnea, unspecified: Secondary | ICD-10-CM

## 2019-09-30 DIAGNOSIS — J4540 Moderate persistent asthma, uncomplicated: Secondary | ICD-10-CM

## 2019-09-30 DIAGNOSIS — Z720 Tobacco use: Secondary | ICD-10-CM

## 2019-09-30 DIAGNOSIS — J45909 Unspecified asthma, uncomplicated: Secondary | ICD-10-CM | POA: Insufficient documentation

## 2019-09-30 DIAGNOSIS — R0609 Other forms of dyspnea: Secondary | ICD-10-CM

## 2019-09-30 DIAGNOSIS — F17200 Nicotine dependence, unspecified, uncomplicated: Secondary | ICD-10-CM

## 2019-09-30 LAB — PULMONARY FUNCTION TEST
DL/VA % pred: 102 %
DL/VA: 4.65 ml/min/mmHg/L
DLCO cor % pred: 101 %
DLCO cor: 22.45 ml/min/mmHg
DLCO unc % pred: 101 %
DLCO unc: 22.45 ml/min/mmHg
FEF 25-75 Post: 3.04 L/sec
FEF 25-75 Pre: 2.1 L/sec
FEF2575-%Change-Post: 44 %
FEF2575-%Pred-Post: 90 %
FEF2575-%Pred-Pre: 62 %
FEV1-%Change-Post: 13 %
FEV1-%Pred-Post: 93 %
FEV1-%Pred-Pre: 82 %
FEV1-Post: 2.92 L
FEV1-Pre: 2.58 L
FEV1FVC-%Change-Post: 9 %
FEV1FVC-%Pred-Pre: 88 %
FEV6-%Change-Post: 4 %
FEV6-%Pred-Post: 97 %
FEV6-%Pred-Pre: 92 %
FEV6-Post: 3.6 L
FEV6-Pre: 3.43 L
FEV6FVC-%Pred-Post: 101 %
FEV6FVC-%Pred-Pre: 101 %
FVC-%Change-Post: 3 %
FVC-%Pred-Post: 96 %
FVC-%Pred-Pre: 93 %
FVC-Post: 3.6 L
FVC-Pre: 3.47 L
Post FEV1/FVC ratio: 81 %
Post FEV6/FVC ratio: 100 %
Pre FEV1/FVC ratio: 74 %
Pre FEV6/FVC Ratio: 100 %
RV % pred: 131 %
RV: 1.88 L
TLC % pred: 104 %
TLC: 5.26 L

## 2019-09-30 NOTE — Assessment & Plan Note (Signed)
Moderate persistent asthma and active smoker.  Patient is encouraged on cessation.  We will continue on Advair.  Continue on trigger prevention. Pulmonary function testing is consistent with an asthmatic component.  She has no significant airflow obstruction.  She does have small airways disease.  Plan  Patient Instructions  Continue on Advair 1 puff Twice daily, rinse after use.  Great job on cutting back on smoking, keep working on quitting smoking  Activity as tolerated.  Albuterol inhaler As needed  Wheezing  Follow up with Dr. Carlis Abbott in 3 months and As needed

## 2019-09-30 NOTE — Assessment & Plan Note (Signed)
Continue with trigger prevention.  No changes.

## 2019-09-30 NOTE — Assessment & Plan Note (Signed)
She is doing well with smoking and cutting back.  She is to continue with her smoking cessation set her quit date and continue on Chantix for at least 1 to 2 months after quitting smoking.

## 2019-09-30 NOTE — Progress Notes (Signed)
@Patient  ID: Bonnie Mathews, female    DOB: 1985/09/30, 34 y.o.   MRN: [id number]  Chief Complaint  Patient presents with  . Follow-up    Asthma     Referring provider: [name], NP  HPI: 34 year old female active smoker seen for pulmonary consult July 31, 2019 for shortness of breath cough and recurrent bronchitis  TEST/EVENTS : Chest x-ray July 24, 2019 clear lungs  09/30/2019 follow-up: Chronic bronchitis/asthma, smoker Patient returns for a 23-month follow-up.  Patient was seen last visit for a pulmonary consult.  She was complaining of ongoing cough shortness of breath and recurrent bronchitis requiring frequent treatments of antibiotics and steroids.  Patient was an active smoker.  She was recommended to change Flovent to Advair.  Recommend on smoking cessation and started on Chantix. Since last visit patient says she is feeling better.  She has noticed that her breathing and cough have decreased on Advair.  She also is cut back on her smoking quite a bit.  She is setting up her quit date in the next week or so.  He says she is tolerating Chantix.  She denies any depression or suicidal thoughts.  Has had some vivid dreams but says they are manageable. She denies any chest pain orthopnea PND or leg swelling.  Pulmonary function testing today was done today that shows normal lung function with FEV1 at 93%, ratio 81, FVC 96%, positive bronchodilator response, moderate mid flow obstruction with significant reversibility, DLCO 101%.    Allergies  Allergen Reactions  . Doxycycline Shortness Of Breath    Immunization History  Administered Date(s) Administered  . Influenza,inj,Quad PF,6+ Mos 12/24/2015, 12/31/2017  . Janssen (J&J) SARS-COV-2 Vaccination 08/02/2019  . Td 09/29/2015    Past Medical History:  Diagnosis Date  . Chickenpox   . GERD (gastroesophageal reflux disease)   . Peptic ulcer   . Tobacco abuse     Tobacco History: Social History   Tobacco Use    Smoking Status Current Every Day Smoker  . Packs/day: 0.50  . Years: 17.00  . Pack years: 8.50  . Types: Cigarettes  Smokeless Tobacco Never Used   Ready to quit: Yes Counseling given: Yes   Outpatient Medications Prior to Visit  Medication Sig Dispense Refill  . albuterol (VENTOLIN HFA) 108 (90 Base) MCG/ACT inhaler Inhale 2 puffs into the lungs every 4 (four) hours as needed for wheezing or shortness of breath. 18 g 11  . amitriptyline (ELAVIL) 100 MG tablet TAKE ONE TABLET BY MOUTH EVERY NIGHT AT BEDTIME 90 tablet 0  . fluticasone (FLOVENT HFA) 110 MCG/ACT inhaler Inhale 1 puff into the lungs 2 (two) times daily. Rinse mouth after use. 1 Inhaler 0  . Fluticasone-Salmeterol (ADVAIR DISKUS) 100-50 MCG/DOSE AEPB Inhale 1 puff into the lungs 2 (two) times daily. 60 each 11  . traMADol (ULTRAM) 50 MG tablet Take 1 tablet (50 mg total) by mouth every 6 (six) hours as needed. for pain 50 tablet 2  . TRI-SPRINTEC 0.18/0.215/0.25 MG-35 MCG tablet Take 1 tablet by mouth daily. 84 tablet 3  . varenicline (CHANTIX CONTINUING MONTH PAK) 1 MG tablet Take 1 tablet (1 mg total) by mouth 2 (two) times daily. 60 tablet 4  . varenicline (CHANTIX PAK) 0.5 MG X 11 & 1 MG X 42 tablet Take one 0.5 mg tablet by mouth once daily for 3 days, then increase to one 0.5 mg tablet twice daily for 4 days, then increase to one 1 mg tablet twice daily.  53 tablet 0   No facility-administered medications prior to visit.     Review of Systems:   Constitutional:   No  weight loss, night sweats,  Fevers, chills, fatigue, or  lassitude.  HEENT:   No headaches,  Difficulty swallowing,  Tooth/dental problems, or  Sore throat,                No sneezing, itching, ear ache,  +nasal congestion, post nasal drip,   CV:  No chest pain,  Orthopnea, PND, swelling in lower extremities, anasarca, dizziness, palpitations, syncope.   GI  No heartburn, indigestion, abdominal pain, nausea, vomiting, diarrhea, change in bowel  habits, loss of appetite, bloody stools.   Resp:  .  No excess mucus, no productive cough,  No non-productive cough,  No coughing up of blood.  No change in color of mucus.  No wheezing.  No chest wall deformity  Skin: no rash or lesions.  GU: no dysuria, change in color of urine, no urgency or frequency.  No flank pain, no hematuria   MS:  No joint pain or swelling.  No decreased range of motion.  No back pain.    Physical Exam  BP 122/82 (BP Location: Left Arm, Cuff Size: Normal)   Pulse 99   Temp (!) 97.3 F (36.3 C) (Oral)   Ht 5\' 3"  (1.6 m)   Wt 219 lb (99.3 kg)   SpO2 95%   BMI 38.79 kg/m   GEN: A/Ox3; pleasant , NAD, well nourished    HEENT:  [place]/AT,   NOSE-clear, THROAT-clear, no lesions, no postnasal drip or exudate noted.   NECK:  Supple w/ fair ROM; no JVD; normal carotid impulses w/o bruits; no thyromegaly or nodules palpated; no lymphadenopathy.    RESP  Clear  P & A; w/o, wheezes/ rales/ or rhonchi. no accessory muscle use, no dullness to percussion  CARD:  RRR, no m/r/g, no peripheral edema, pulses intact, no cyanosis or clubbing.  GI:   Soft & nt; nml bowel sounds; no organomegaly or masses detected.   Musco: Warm bil, no deformities or joint swelling noted.   Neuro: alert, no focal deficits noted.    Skin: Warm, no lesions or rashes    Lab Results:  CBC    Component Value Date/Time   WBC 12.5 (H) 07/15/2019 1031   RBC 4.71 07/15/2019 1031   HGB 15.7 (H) 07/15/2019 1031   HGB 14.6 07/31/2013 2241   HCT 46.5 (H) 07/15/2019 1031   HCT 44.9 07/31/2013 2241   PLT 248.0 07/15/2019 1031   PLT 259 07/31/2013 2241   MCV 98.8 07/15/2019 1031   MCV 93 07/31/2013 2241   MCH 31.8 10/07/2014 1719   MCHC 33.7 07/15/2019 1031   RDW 14.5 07/15/2019 1031   RDW 13.4 07/31/2013 2241   LYMPHSABS 1.7 10/07/2014 1719   LYMPHSABS 2.9 07/31/2013 2241   MONOABS 0.7 10/07/2014 1719   MONOABS 0.8 07/31/2013 2241   EOSABS 0.3 10/07/2014 1719   EOSABS 0.6  07/31/2013 2241   BASOSABS 0.1 10/07/2014 1719   BASOSABS 0.1 07/31/2013 2241    BMET    Component Value Date/Time   NA 137 07/15/2019 1031   NA 138 07/31/2013 2241   K 4.9 07/15/2019 1031   K 3.8 07/31/2013 2241   CL 102 07/15/2019 1031   CL 109 (H) 07/31/2013 2241   CO2 29 07/15/2019 1031   CO2 23 07/31/2013 2241   GLUCOSE 83 07/15/2019 1031   GLUCOSE 97 07/31/2013 2241  BUN 15 07/15/2019 1031   BUN 18 07/31/2013 2241   CREATININE 0.57 07/15/2019 1031   CREATININE 0.60 07/31/2013 2241   CALCIUM 9.6 07/15/2019 1031   CALCIUM 9.0 07/31/2013 2241   GFRNONAA >60 10/07/2014 1719   GFRNONAA >60 07/31/2013 2241   GFRAA >60 10/07/2014 1719   GFRAA >60 07/31/2013 2241    BNP No results found for: BNP  ProBNP No results found for: PROBNP  Imaging: No results found.    PFT Results Latest Ref Rng & Units 09/30/2019  FVC-Pre L 3.47  FVC-Predicted Pre % 93  FVC-Post L 3.60  FVC-Predicted Post % 96  Pre FEV1/FVC % % 74  Post FEV1/FCV % % 81  FEV1-Pre L 2.58  FEV1-Predicted Pre % 82  FEV1-Post L 2.92  DLCO UNC% % 101  DLCO COR %Predicted % 102  TLC L 5.26  TLC % Predicted % 104  RV % Predicted % 131    No results found for: NITRICOXIDE      Assessment & Plan:   Asthma Moderate persistent asthma and active smoker.  Patient is encouraged on cessation.  We will continue on Advair.  Continue on trigger prevention. Pulmonary function testing is consistent with an asthmatic component.  She has no significant airflow obstruction.  She does have small airways disease.  Plan  Patient Instructions  Continue on Advair 1 puff Twice daily, rinse after use.  Great job on cutting back on smoking, keep working on quitting smoking  Activity as tolerated.  Albuterol inhaler As needed  Wheezing  Follow up with Dr. Carlis Abbott in 3 months and As needed        ALLERGIC RHINITIS, SEASONAL Continue with trigger prevention.  No changes.  TOBACCO ABUSE She is doing well with  smoking and cutting back.  She is to continue with her smoking cessation set her quit date and continue on Chantix for at least 1 to 2 months after quitting smoking.     Rexene Edison, NP 09/30/2019

## 2019-09-30 NOTE — Patient Instructions (Addendum)
Continue on Advair 1 puff Twice daily, rinse after use.  Great job on cutting back on smoking, keep working on quitting smoking  Activity as tolerated.  Albuterol inhaler As needed  Wheezing  Follow up with Dr. Carlis Abbott in 3 months and As needed

## 2019-09-30 NOTE — Progress Notes (Signed)
PFT done today. 

## 2019-10-09 NOTE — Progress Notes (Signed)
Agree  [name], DO 10/09/19 8:55 PM [facility]

## 2019-11-20 ENCOUNTER — Other Ambulatory Visit: Payer: Self-pay | Admitting: Primary Care

## 2019-11-20 DIAGNOSIS — R202 Paresthesia of skin: Secondary | ICD-10-CM

## 2019-11-20 DIAGNOSIS — R200 Anesthesia of skin: Secondary | ICD-10-CM

## 2019-11-20 DIAGNOSIS — G8929 Other chronic pain: Secondary | ICD-10-CM

## 2020-02-01 ENCOUNTER — Encounter: Payer: Self-pay | Admitting: Emergency Medicine

## 2020-02-01 ENCOUNTER — Emergency Department: Payer: 59

## 2020-02-01 ENCOUNTER — Emergency Department
Admission: EM | Admit: 2020-02-01 | Discharge: 2020-02-01 | Disposition: A | Discharge status: Home / Self Care | Payer: 59 | Attending: Emergency Medicine | Admitting: Emergency Medicine

## 2020-02-01 ENCOUNTER — Other Ambulatory Visit: Payer: Self-pay

## 2020-02-01 DIAGNOSIS — J45909 Unspecified asthma, uncomplicated: Secondary | ICD-10-CM | POA: Diagnosis not present

## 2020-02-01 DIAGNOSIS — S8391XA Sprain of unspecified site of right knee, initial encounter: Secondary | ICD-10-CM | POA: Diagnosis not present

## 2020-02-01 DIAGNOSIS — F1590 Other stimulant use, unspecified, uncomplicated: Secondary | ICD-10-CM | POA: Insufficient documentation

## 2020-02-01 DIAGNOSIS — F17210 Nicotine dependence, cigarettes, uncomplicated: Secondary | ICD-10-CM | POA: Insufficient documentation

## 2020-02-01 DIAGNOSIS — S80911A Unspecified superficial injury of right knee, initial encounter: Secondary | ICD-10-CM | POA: Diagnosis present

## 2020-02-01 MED ORDER — DICLOFENAC SODIUM 75 MG PO TBEC: 75.0000 mg | DELAYED_RELEASE_TABLET | Freq: Two times a day (BID) | ORAL | 0 refills | Status: DC

## 2020-02-01 MED ORDER — TIZANIDINE HCL 4 MG PO TABS: 4.0000 mg | ORAL_TABLET | Freq: Four times a day (QID) | ORAL | 0 refills | Status: DC | PRN
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
## 2020-02-01 NOTE — ED Notes (Signed)
Pt brought pt into triage room 3 to be seen by PA Myriam Jacobson, advised patient that the provider will evaluate her in triage room and that she may have to go back to the lobby to wait for further testing or evaluation until the fast track opens
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
## 2020-02-27 ENCOUNTER — Encounter: Payer: Self-pay | Admitting: Primary Care

## 2020-05-06 ENCOUNTER — Other Ambulatory Visit: Payer: Self-pay

## 2020-05-06 ENCOUNTER — Encounter: Payer: Self-pay | Admitting: Family Medicine

## 2020-05-06 DIAGNOSIS — R0602 Shortness of breath: Secondary | ICD-10-CM

## 2020-05-06 DIAGNOSIS — R200 Anesthesia of skin: Secondary | ICD-10-CM

## 2020-05-06 DIAGNOSIS — R202 Paresthesia of skin: Secondary | ICD-10-CM

## 2020-05-06 DIAGNOSIS — J301 Allergic rhinitis due to pollen: Secondary | ICD-10-CM

## 2020-05-06 DIAGNOSIS — R053 Chronic cough: Secondary | ICD-10-CM

## 2020-05-06 DIAGNOSIS — G8929 Other chronic pain: Secondary | ICD-10-CM

## 2020-05-06 MED ORDER — FLOVENT HFA 110 MCG/ACT IN AERO: 1.0000 | INHALATION_SPRAY | Freq: Two times a day (BID) | RESPIRATORY_TRACT | 0 refills | Status: DC

## 2020-05-06 MED ORDER — ALBUTEROL SULFATE HFA 108 (90 BASE) MCG/ACT IN AERS: 2.0000 | INHALATION_SPRAY | RESPIRATORY_TRACT | 11 refills | Status: DC | PRN

## 2020-05-06 MED ORDER — AMITRIPTYLINE HCL 100 MG PO TABS: 100.0000 mg | ORAL_TABLET | Freq: Every day | ORAL | 0 refills | Status: DC

## 2020-05-06 NOTE — Telephone Encounter (Signed)
I will refill the amitriptyline but not Tramadol.  She will need to be seen by April 2022 for further refills of anything. Bonnie Mathews, can you schedule her for follow up in March/April 2022?

## 2020-05-06 NOTE — Telephone Encounter (Signed)
Requesting: elavil 100mg  and tramadol 50mg  Contract:no UDS:no Last OV:07/28/19 Next OV:n/a Last Refill:elevail 11/21/19 #[id number] Tramadol 08/12/19 #[id number] Database:   Please advise

## 2020-05-07 NOTE — Telephone Encounter (Signed)
Called patient to schedule OV for med refill follow up. LVM to call back.

## 2020-05-19 NOTE — Telephone Encounter (Signed)
Spoke with patient and advised of below. Appointment scheduled for March.  Patient does request her Advair to be filled, she did not pick up Flovent due to [place] works better for her. Please review.

## 2020-05-19 NOTE — Telephone Encounter (Signed)
Called patient to schedule OV. LVM to call back. Letter sent.

## 2020-05-20 MED ORDER — FLUTICASONE-SALMETEROL 100-50 MCG/DOSE IN AEPB: 1.0000 | INHALATION_SPRAY | Freq: Two times a day (BID) | RESPIRATORY_TRACT | 2 refills | Status: DC

## 2020-05-20 NOTE — Telephone Encounter (Signed)
It looks like the Advair was prescribed by pulmonology. We will refill and discuss further pulmonology follow-up.

## 2020-06-29 ENCOUNTER — Ambulatory Visit (INDEPENDENT_AMBULATORY_CARE_PROVIDER_SITE_OTHER): Payer: 59 | Admitting: Primary Care

## 2020-06-29 ENCOUNTER — Other Ambulatory Visit: Payer: Self-pay

## 2020-06-29 ENCOUNTER — Encounter: Payer: Self-pay | Admitting: Primary Care

## 2020-06-29 VITALS — BP 110/70 | HR 111 | Temp 97.7°F | Ht 63.0 in | Wt 216.8 lb

## 2020-06-29 DIAGNOSIS — Z789 Other specified health status: Secondary | ICD-10-CM | POA: Diagnosis not present

## 2020-06-29 DIAGNOSIS — M25511 Pain in right shoulder: Secondary | ICD-10-CM

## 2020-06-29 DIAGNOSIS — Z3041 Encounter for surveillance of contraceptive pills: Secondary | ICD-10-CM | POA: Insufficient documentation

## 2020-06-29 DIAGNOSIS — R200 Anesthesia of skin: Secondary | ICD-10-CM | POA: Diagnosis not present

## 2020-06-29 DIAGNOSIS — G8929 Other chronic pain: Secondary | ICD-10-CM

## 2020-06-29 DIAGNOSIS — F17200 Nicotine dependence, unspecified, uncomplicated: Secondary | ICD-10-CM

## 2020-06-29 DIAGNOSIS — J4540 Moderate persistent asthma, uncomplicated: Secondary | ICD-10-CM

## 2020-06-29 DIAGNOSIS — R202 Paresthesia of skin: Secondary | ICD-10-CM

## 2020-06-29 MED ORDER — VARENICLINE TARTRATE 0.5 MG X 11 & 1 MG X 42 PO MISC: ORAL | 0 refills | Status: DC

## 2020-06-29 MED ORDER — TRI-SPRINTEC 0.18/0.215/0.25 MG-35 MCG PO TABS: 1.0000 | ORAL_TABLET | Freq: Every day | ORAL | 3 refills | Status: DC

## 2020-06-29 MED ORDER — AMITRIPTYLINE HCL 100 MG PO TABS: 100.0000 mg | ORAL_TABLET | Freq: Every day | ORAL | 1 refills | Status: DC

## 2020-06-29 MED ORDER — VARENICLINE TARTRATE 1 MG PO TABS: 1.0000 mg | ORAL_TABLET | Freq: Two times a day (BID) | ORAL | 0 refills | Status: DC

## 2020-06-29 NOTE — Progress Notes (Signed)
Subjective:    Patient ID: Bonnie Mathews, female    DOB: June 12, 1985, 35 y.o.   MRN: [id number]  HPI  This visit occurred during the SARS-CoV-2 public health emergency.  Safety protocols were in place, including screening questions prior to the visit, additional usage of staff PPE, and extensive cleaning of exam room while observing appropriate contact time as indicated for disinfecting solutions.   Bonnie Mathews is a 35 year old female with a history of asthma, GERD, tobacco abuse, chronic right shoulder pain, chronic cough who presents today for follow-up of medical conditions.  1) Asthma: Currently managed on Advair 100-50 mcg twice daily, albuterol as needed. No recent visit, plans on scheduling a follow up visit. Smoking 1 PPD of cigarettes, reduced from 1-2 PPD. She did have side effects of vivid dreams/nightmares. She did notice improvement on Chantix, but was unable to quit.  She would like to try again.  2) Chronic Shoulder/Hip Pain: Previously following with Dr. Lorelei Pont, managed on amitriptyline 100 mg HS for pain, takes most days of the week, overall this helps. Previously managed on Tramadol per Dr. Lorelei Pont. Should pain persists, worse with repetitive movement. She's undergone PT for her shoulder, does use a TENS machine with some improvement.   3) Birth Control: Currently managed on Tri-Sprintec OCP's. Last pap smear was in August 2020, negative. Doing well on OCP's, regular menstrual cycles.   BP Readings from Last 3 Encounters:  06/29/20 110/70  02/01/20 (!) 132/102  09/30/19 122/82     Review of Systems  Respiratory: Positive for cough, shortness of breath and wheezing.        Chronic, tobacco abuse  Musculoskeletal: Positive for arthralgias.       Chronic shoulder pain  Allergic/Immunologic: Positive for environmental allergies.       Past Medical History:  Diagnosis Date  . Chickenpox   . GERD (gastroesophageal reflux disease)   . Peptic ulcer   . Tobacco abuse       Social History   Socioeconomic History  . Marital status: Single    Spouse name: Not on file  . Number of children: Not on file  . Years of education: Not on file  . Highest education level: Not on file  Occupational History  . Not on file  Tobacco Use  . Smoking status: Current Every Day Smoker    Packs/day: 0.50    Years: 17.00    Pack years: 8.50    Types: Cigarettes  . Smokeless tobacco: Never Used  Vaping Use  . Vaping Use: Never used  Substance and Sexual Activity  . Alcohol use: Yes    Comment: Occ  . Drug use: Yes    Types: Marijuana  . Sexual activity: Not on file  Other Topics Concern  . Not on file  Social History Narrative   Single.   1 child.   Works as a Freight forwarder at [employer]   Enjoys working out and watching TV.   She's working on her GED.    Social Determinants of Health   Financial Resource Strain: Not on file  Food Insecurity: Not on file  Transportation Needs: Not on file  Physical Activity: Not on file  Stress: Not on file  Social Connections: Not on file  Intimate Partner Violence: Not on file    Past Surgical History:  Procedure Laterality Date  . FOOT SURGERY Right    lipoma removed from arch  . [place]  History  Problem Relation Age of Onset  . Alcohol abuse Paternal Grandfather   . Mental illness Paternal Grandfather   . Arthritis Mother   . Heart disease Mother   . Diabetes Mother   . Hypertension Father   . Arthritis Maternal Aunt   . Breast cancer Maternal Aunt   . Alcohol abuse Paternal Uncle   . Mental illness Maternal Grandmother   . Mental illness Maternal Grandfather   . Colon cancer Paternal Grandmother   . Mental illness Paternal Grandmother     Allergies  Allergen Reactions  . Doxycycline Shortness Of Breath    Current Outpatient Medications on File Prior to Visit  Medication Sig Dispense Refill  . albuterol (VENTOLIN HFA) 108 (90 Base) MCG/ACT inhaler Inhale  2 puffs into the lungs every 4 (four) hours as needed for wheezing or shortness of breath. 18 g 11  . Fluticasone-Salmeterol (ADVAIR DISKUS) 100-50 MCG/DOSE AEPB Inhale 1 puff into the lungs 2 (two) times daily. 60 each 2   No current facility-administered medications on file prior to visit.    BP 110/70   Pulse (!) 111   Temp 97.7 F (36.5 C) (Temporal)   Ht 5\' 3"  (1.6 m)   Wt 216 lb 12 oz (98.3 kg)   SpO2 97%   BMI 38.40 kg/m    Objective:   Physical Exam Constitutional:      Appearance: She is well-nourished.  Cardiovascular:     Rate and Rhythm: Normal rate and regular rhythm.  Pulmonary:     Effort: Pulmonary effort is normal.     Breath sounds: Examination of the right-upper field reveals wheezing. Examination of the left-upper field reveals wheezing. Wheezing present.  Musculoskeletal:     Cervical back: Neck supple.  Skin:    General: Skin is warm and dry.  Psychiatric:        Mood and Affect: Mood and affect normal.            Assessment & Plan:

## 2020-06-29 NOTE — Assessment & Plan Note (Signed)
Smoking 1 pack/day of cigarettes, ready to quit.  Was almost successful with Chantix in the past.  Agreed to refill, prescription for starter and continuing packs provided.

## 2020-06-29 NOTE — Assessment & Plan Note (Signed)
Doing well on OCPs, refills provided. Due for Pap smear in August 2023.

## 2020-06-29 NOTE — Assessment & Plan Note (Signed)
Compliant and overall seems well managed on Advair 100-25 micrograms twice daily.  Using albuterol infrequently as needed.  Recommended she follow-up with pulmonology as discussed.  Also strongly advise she quit smoking.

## 2020-06-29 NOTE — Assessment & Plan Note (Signed)
Overall seems to do well on amitriptyline 100 mg for which she takes on working days. Continue same.  Discussed that I do not prescribe tramadol, she can discuss with Dr. Lorelei Pont. I did recommend follow up if symptoms progressed.

## 2020-06-29 NOTE — Patient Instructions (Signed)
Resume Chantix starting pack, then proceed with continuing pack.  Follow-up with the pulmonologist as discussed.  I sent refills to your pharmacy.  We will see you next year for your full physical and Pap smear.  It was a pleasure to see you today!

## 2020-08-04 ENCOUNTER — Ambulatory Visit: Payer: 59 | Admitting: Primary Care

## 2020-12-23 ENCOUNTER — Other Ambulatory Visit: Payer: Self-pay

## 2020-12-23 ENCOUNTER — Encounter: Payer: Self-pay | Admitting: Primary Care

## 2020-12-23 ENCOUNTER — Ambulatory Visit (INDEPENDENT_AMBULATORY_CARE_PROVIDER_SITE_OTHER): Payer: 59 | Admitting: Primary Care

## 2020-12-23 VITALS — BP 108/74 | HR 102 | Temp 98.6°F | Ht 63.0 in | Wt 195.0 lb

## 2020-12-23 DIAGNOSIS — M25511 Pain in right shoulder: Secondary | ICD-10-CM

## 2020-12-23 DIAGNOSIS — K219 Gastro-esophageal reflux disease without esophagitis: Secondary | ICD-10-CM

## 2020-12-23 DIAGNOSIS — R200 Anesthesia of skin: Secondary | ICD-10-CM

## 2020-12-23 DIAGNOSIS — Z3041 Encounter for surveillance of contraceptive pills: Secondary | ICD-10-CM

## 2020-12-23 DIAGNOSIS — Z114 Encounter for screening for human immunodeficiency virus [HIV]: Secondary | ICD-10-CM

## 2020-12-23 DIAGNOSIS — J4540 Moderate persistent asthma, uncomplicated: Secondary | ICD-10-CM | POA: Diagnosis not present

## 2020-12-23 DIAGNOSIS — Z0000 Encounter for general adult medical examination without abnormal findings: Secondary | ICD-10-CM

## 2020-12-23 DIAGNOSIS — F17200 Nicotine dependence, unspecified, uncomplicated: Secondary | ICD-10-CM

## 2020-12-23 DIAGNOSIS — Z1159 Encounter for screening for other viral diseases: Secondary | ICD-10-CM

## 2020-12-23 DIAGNOSIS — G8929 Other chronic pain: Secondary | ICD-10-CM

## 2020-12-23 DIAGNOSIS — R202 Paresthesia of skin: Secondary | ICD-10-CM

## 2020-12-23 LAB — LIPID PANEL
Cholesterol: 138 mg/dL (ref 0–200)
HDL: 72.8 mg/dL (ref >39.00)
LDL Cholesterol: 50 mg/dL (ref 0–99)
NonHDL: 65.28
Total CHOL/HDL Ratio: 2
Triglycerides: 76 mg/dL (ref 0.0–149.0)
VLDL: 15.2 mg/dL (ref 0.0–40.0)

## 2020-12-23 LAB — CBC
HCT: 48.6 % — ABNORMAL HIGH (ref 36.0–46.0)
Hemoglobin: 16.2 g/dL — ABNORMAL HIGH (ref 12.0–15.0)
MCHC: 33.4 g/dL (ref 30.0–36.0)
MCV: 104.2 fl — ABNORMAL HIGH (ref 78.0–100.0)
Platelets: 251 10*3/uL (ref 150.0–400.0)
RBC: 4.66 Mil/uL (ref 3.87–5.11)
RDW: 15.1 % (ref 11.5–15.5)
WBC: 7.2 10*3/uL (ref 4.0–10.5)

## 2020-12-23 LAB — COMPREHENSIVE METABOLIC PANEL
ALT: 29 U/L (ref 0–35)
AST: 26 U/L (ref 0–37)
Albumin: 4.1 g/dL (ref 3.5–5.2)
Alkaline Phosphatase: 82 U/L (ref 39–117)
BUN: 10 mg/dL (ref 6–23)
CO2: 28 mEq/L (ref 19–32)
Calcium: 9.4 mg/dL (ref 8.4–10.5)
Chloride: 102 mEq/L (ref 96–112)
Creatinine, Ser: 0.6 mg/dL (ref 0.40–1.20)
GFR: 116.71 mL/min (ref >60.00)
Glucose, Bld: 85 mg/dL (ref 70–99)
Potassium: 4.1 mEq/L (ref 3.5–5.1)
Sodium: 137 mEq/L (ref 135–145)
Total Bilirubin: 1.4 mg/dL — ABNORMAL HIGH (ref 0.2–1.2)
Total Protein: 7.3 g/dL (ref 6.0–8.3)

## 2020-12-23 MED ORDER — ALBUTEROL SULFATE HFA 108 (90 BASE) MCG/ACT IN AERS: 2.0000 | INHALATION_SPRAY | RESPIRATORY_TRACT | 0 refills | Status: DC | PRN

## 2020-12-23 MED ORDER — VARENICLINE TARTRATE 0.5 MG X 11 & 1 MG X 42 PO MISC: ORAL | 0 refills | Status: DC

## 2020-12-23 MED ORDER — FLUTICASONE-SALMETEROL 100-50 MCG/ACT IN AEPB: 1.0000 | INHALATION_SPRAY | Freq: Two times a day (BID) | RESPIRATORY_TRACT | 11 refills | Status: DC

## 2020-12-23 MED ORDER — VARENICLINE TARTRATE 1 MG PO TABS: 1.0000 mg | ORAL_TABLET | Freq: Two times a day (BID) | ORAL | 0 refills | Status: DC

## 2020-12-23 NOTE — Assessment & Plan Note (Signed)
Well controlled, no longer on daily treatment. She is aware of triggers.  Continue to monitor.

## 2020-12-23 NOTE — Assessment & Plan Note (Signed)
Ready to quit smoking, was never able to start Chantix in March 2022.   Re-ordered Chantix.

## 2020-12-23 NOTE — Assessment & Plan Note (Signed)
She would like to resume OCP's, she has refills at the pharmacy and will refill.  Monthly menses. Pap smear UTD, due in 2023.

## 2020-12-23 NOTE — Assessment & Plan Note (Signed)
Increased symptoms recently since exercise outdoors, has had an asthma attack. Has been out of inhalers for months.   Refills provided for Advair 100-50 BID and albuterol PRN.  She will update if symptoms do not improve.

## 2020-12-23 NOTE — Progress Notes (Signed)
Subjective:    Patient ID: Bonnie Mathews, female    DOB: 04/22/1986, 35 y.o.   MRN: [id number]  HPI  Bonnie Mathews is a very pleasant 35 y.o. female with a history of asthma, GERD, chronic foot and shoulder pain, tobacco abuse who presents today for complete physical and follow up of chronic conditions.  She would also like to start Chantix for tobacco abuse. She was prescribed this in March 2022 but never started.   Immunizations: -Tetanus: 2017 -Influenza: Declines  -Covid-19: 1 Janssen   Diet: Fair diet.  Exercise: Regular exercise, hiking and walking.  Eye exam: Completes annually  Dental exam: Completes semi-annually   Pap Smear: Completed in August 2020  BP Readings from Last 3 Encounters:  12/23/20 108/74  06/29/20 110/70  02/01/20 (!) 132/102   Wt Readings from Last 3 Encounters:  12/23/20 195 lb (88.5 kg)  06/29/20 216 lb 12 oz (98.3 kg)  02/01/20 220 lb (99.8 kg)        Review of Systems  Constitutional:  Negative for unexpected weight change.  HENT:  Negative for rhinorrhea.   Respiratory:  Positive for cough and shortness of breath.   Cardiovascular:  Negative for chest pain.  Gastrointestinal:  Negative for constipation and diarrhea.  Genitourinary:  Negative for difficulty urinating and menstrual problem.  Musculoskeletal:  Negative for arthralgias and myalgias.  Skin:  Negative for rash.  Allergic/Immunologic: Negative for environmental allergies.  Neurological:  Negative for dizziness and headaches.  Psychiatric/Behavioral:  The patient is not nervous/anxious.         Past Medical History:  Diagnosis Date   Chickenpox    GERD (gastroesophageal reflux disease)    Peptic ulcer    Tobacco abuse     Social History   Socioeconomic History   Marital status: Single    Spouse name: Not on file   Number of children: Not on file   Years of education: Not on file   Highest education level: Not on file  Occupational History   Not on  file  Tobacco Use   Smoking status: Every Day    Packs/day: 0.50    Years: 17.00    Pack years: 8.50    Types: Cigarettes   Smokeless tobacco: Never  Vaping Use   Vaping Use: Never used  Substance and Sexual Activity   Alcohol use: Yes    Comment: Occ   Drug use: Yes    Types: Marijuana   Sexual activity: Not on file  Other Topics Concern   Not on file  Social History Narrative   Single.   1 child.   Works as a Freight forwarder at [employer]   Enjoys working out and watching TV.   She's working on her GED.    Social Determinants of Health   Financial Resource Strain: Not on file  Food Insecurity: Not on file  Transportation Needs: Not on file  Physical Activity: Not on file  Stress: Not on file  Social Connections: Not on file  Intimate Partner Violence: Not on file    Past Surgical History:  Procedure Laterality Date   FOOT SURGERY Right    lipoma removed from arch   TONSILLECTOMY AND ADENOIDECTOMY  1993    Family History  Problem Relation Age of Onset   Alcohol abuse Paternal Grandfather    Mental illness Paternal Grandfather    Arthritis Mother    Heart disease Mother    Diabetes Mother    Hypertension Father  Arthritis Maternal Aunt    Breast cancer Maternal Aunt    Alcohol abuse Paternal Uncle    Mental illness Maternal Grandmother    Mental illness Maternal Grandfather    Colon cancer Paternal Grandmother    Mental illness Paternal Grandmother     Allergies  Allergen Reactions   Doxycycline Shortness Of Breath    Current Outpatient Medications on File Prior to Visit  Medication Sig Dispense Refill   albuterol (VENTOLIN HFA) 108 (90 Base) MCG/ACT inhaler Inhale 2 puffs into the lungs every 4 (four) hours as needed for wheezing or shortness of breath. 18 g 11   amitriptyline (ELAVIL) 100 MG tablet Take 1 tablet (100 mg total) by mouth at bedtime. For pain. 90 tablet 1   Fluticasone-Salmeterol (ADVAIR DISKUS) 100-50 MCG/DOSE AEPB Inhale 1 puff into  the lungs 2 (two) times daily. 60 each 2   TRI-SPRINTEC 0.18/0.215/0.25 MG-35 MCG tablet Take 1 tablet by mouth daily. 84 tablet 3   varenicline (CHANTIX CONTINUING MONTH PAK) 1 MG tablet Take 1 tablet (1 mg total) by mouth 2 (two) times daily. 60 tablet 0   varenicline (CHANTIX PAK) 0.5 MG X 11 & 1 MG X 42 tablet Take one 0.5 mg tablet by mouth once daily for 3 days, then increase to one 0.5 mg tablet twice daily for 4 days, then increase to one 1 mg tablet twice daily. 53 tablet 0   No current facility-administered medications on file prior to visit.    BP 108/74   Pulse (!) 102   Temp 98.6 F (37 C) (Temporal)   Ht '5\' 3"'$  (1.6 m)   Wt 195 lb (88.5 kg)   LMP 11/09/2020   SpO2 94%   BMI 34.54 kg/m  Objective:   Physical Exam HENT:     Right Ear: Tympanic membrane and ear canal normal.     Left Ear: Tympanic membrane and ear canal normal.     Nose: Nose normal.  Eyes:     Conjunctiva/sclera: Conjunctivae normal.     Pupils: Pupils are equal, round, and reactive to light.  Neck:     Thyroid: No thyromegaly.  Cardiovascular:     Rate and Rhythm: Normal rate and regular rhythm.     Heart sounds: No murmur heard. Pulmonary:     Effort: Pulmonary effort is normal.     Breath sounds: Wheezing present. No rales.  Abdominal:     General: Bowel sounds are normal.     Palpations: Abdomen is soft.     Tenderness: There is no abdominal tenderness.  Musculoskeletal:        General: Normal range of motion.     Cervical back: Neck supple.  Lymphadenopathy:     Cervical: No cervical adenopathy.  Skin:    General: Skin is warm and dry.     Findings: No rash.  Neurological:     Mental Status: She is alert and oriented to person, place, and time.     Cranial Nerves: No cranial nerve deficit.     Deep Tendon Reflexes: Reflexes are normal and symmetric.  Psychiatric:        Mood and Affect: Mood normal.          Assessment & Plan:      This visit occurred during the  SARS-CoV-2 public health emergency.  Safety protocols were in place, including screening questions prior to the visit, additional usage of staff PPE, and extensive cleaning of exam room while observing appropriate contact time as  indicated for disinfecting solutions.

## 2020-12-23 NOTE — Assessment & Plan Note (Signed)
Immunizations UTD. Pap smear UTD, due in 2023.  Discussed the importance of a healthy diet and regular exercise in order for weight loss, and to reduce the risk of further co-morbidity.  Exam today stable. Labs pending.

## 2020-12-23 NOTE — Assessment & Plan Note (Signed)
Doing well on amitriptyline 100 mg for which she takes PRN. Continue same.

## 2020-12-23 NOTE — Assessment & Plan Note (Signed)
Managed on amitriptyline 100 mg for which she takes PRN. Continue same.

## 2020-12-23 NOTE — Patient Instructions (Addendum)
Stop by the lab prior to leaving today. I will notify you of your results once received.   Start with the Chantix starting pack, then move to the continuing pack. Stop smoking within 1-2 weeks of starting Chantix.  You have refills of your birth control at the pharmacy.   Resume your Advair inhaler twice daily. Use the albuterol only if needed for shortness of breath, cough, wheezing.   It was a pleasure to see you today!  Preventive Care 20-35 Years Old, Female Preventive care refers to lifestyle choices and visits with your health care provider that can promote health and wellness. This includes: A yearly physical exam. This is also called an annual wellness visit. Regular dental and eye exams. Immunizations. Screening for certain conditions. Healthy lifestyle choices, such as: Eating a healthy diet. Getting regular exercise. Not using drugs or products that contain nicotine and tobacco. Limiting alcohol use. What can I expect for my preventive care visit? Physical exam Your health care provider may check your: Height and weight. These may be used to calculate your BMI (body mass index). BMI is a measurement that tells if you are at a healthy weight. Heart rate and blood pressure. Body temperature. Skin for abnormal spots. Counseling Your health care provider may ask you questions about your: Past medical problems. Family's medical history. Alcohol, tobacco, and drug use. Emotional well-being. Home life and relationship well-being. Sexual activity. Diet, exercise, and sleep habits. Work and work Statistician. Access to firearms. Method of birth control. Menstrual cycle. Pregnancy history. What immunizations do I need? Vaccines are usually given at various ages, according to a schedule. Your health care provider will recommend vaccines for you based on your age, medical history, and lifestyle or other factors, such as travel or where you work. What tests do I need? Blood  tests Lipid and cholesterol levels. These may be checked every 5 years starting at age 88. Hepatitis C test. Hepatitis B test. Screening Diabetes screening. This is done by checking your blood sugar (glucose) after you have not eaten for a while (fasting). STD (sexually transmitted disease) testing, if you are at risk. BRCA-related cancer screening. This may be done if you have a family history of breast, ovarian, tubal, or peritoneal cancers. Pelvic exam and Pap test. This may be done every 3 years starting at age 18. Starting at age 28, this may be done every 5 years if you have a Pap test in combination with an HPV test. Talk with your health care provider about your test results, treatment options, and if necessary, the need for more tests. Follow these instructions at home: Eating and drinking  Eat a healthy diet that includes fresh fruits and vegetables, whole grains, lean protein, and low-fat dairy products. Take vitamin and mineral supplements as recommended by your health care provider. Do not drink alcohol if: Your health care provider tells you not to drink. You are pregnant, may be pregnant, or are planning to become pregnant. If you drink alcohol: Limit how much you have to 0-1 drink a day. Be aware of how much alcohol is in your drink. In the [place] one drink equals one 12 oz bottle of beer (355 mL), one 5 oz glass of wine (148 mL), or one 1 oz glass of hard liquor (44 mL). Lifestyle Take daily care of your teeth and gums. Brush your teeth every morning and night with fluoride toothpaste. Floss one time each day. Stay active. Exercise for at least 30 minutes 5 or more  days each week. Do not use any products that contain nicotine or tobacco, such as cigarettes, e-cigarettes, and chewing tobacco. If you need help quitting, ask your health care provider. Do not use drugs. If you are sexually active, practice safe sex. Use a condom or other form of protection to prevent STIs  (sexually transmitted infections). If you do not wish to become pregnant, use a form of birth control. If you plan to become pregnant, see your health care provider for a prepregnancy visit. Find healthy ways to cope with stress, such as: Meditation, yoga, or listening to music. Journaling. Talking to a trusted person. Spending time with friends and family. Safety Always wear your seat belt while driving or riding in a vehicle. Do not drive: If you have been drinking alcohol. Do not ride with someone who has been drinking. When you are tired or distracted. While texting. Wear a helmet and other protective equipment during sports activities. If you have firearms in your house, make sure you follow all gun safety procedures. Seek help if you have been physically or sexually abused. What's next? Go to your health care provider once a year for an annual wellness visit. Ask your health care provider how often you should have your eyes and teeth checked. Stay up to date on all vaccines. This information is not intended to replace advice given to you by your health care provider. Make sure you discuss any questions you have with your health care provider. Document Revised: 06/18/2020 Document Reviewed: 12/20/2017 Elsevier Patient Education  2022 [employer].

## 2020-12-23 NOTE — Assessment & Plan Note (Signed)
Would like to resume OCP's, she has refills at the pharmacy. Regular menses.

## 2020-12-24 LAB — HEPATITIS C ANTIBODY
Hepatitis C Ab: NONREACTIVE
SIGNAL TO CUT-OFF: 0.01 (ref <1.00)

## 2020-12-24 LAB — HIV ANTIBODY (ROUTINE TESTING W REFLEX): HIV 1&2 Ab, 4th Generation: NONREACTIVE

## 2021-01-24 IMAGING — DX DG CHEST 2V
2 series · 2 of 2 positions shown · non-contrast
Comparison: October 23, 2014

CLINICAL DATA: Cough; leukocytosis

EXAM:
CHEST - 2 VIEW

[chest pa]
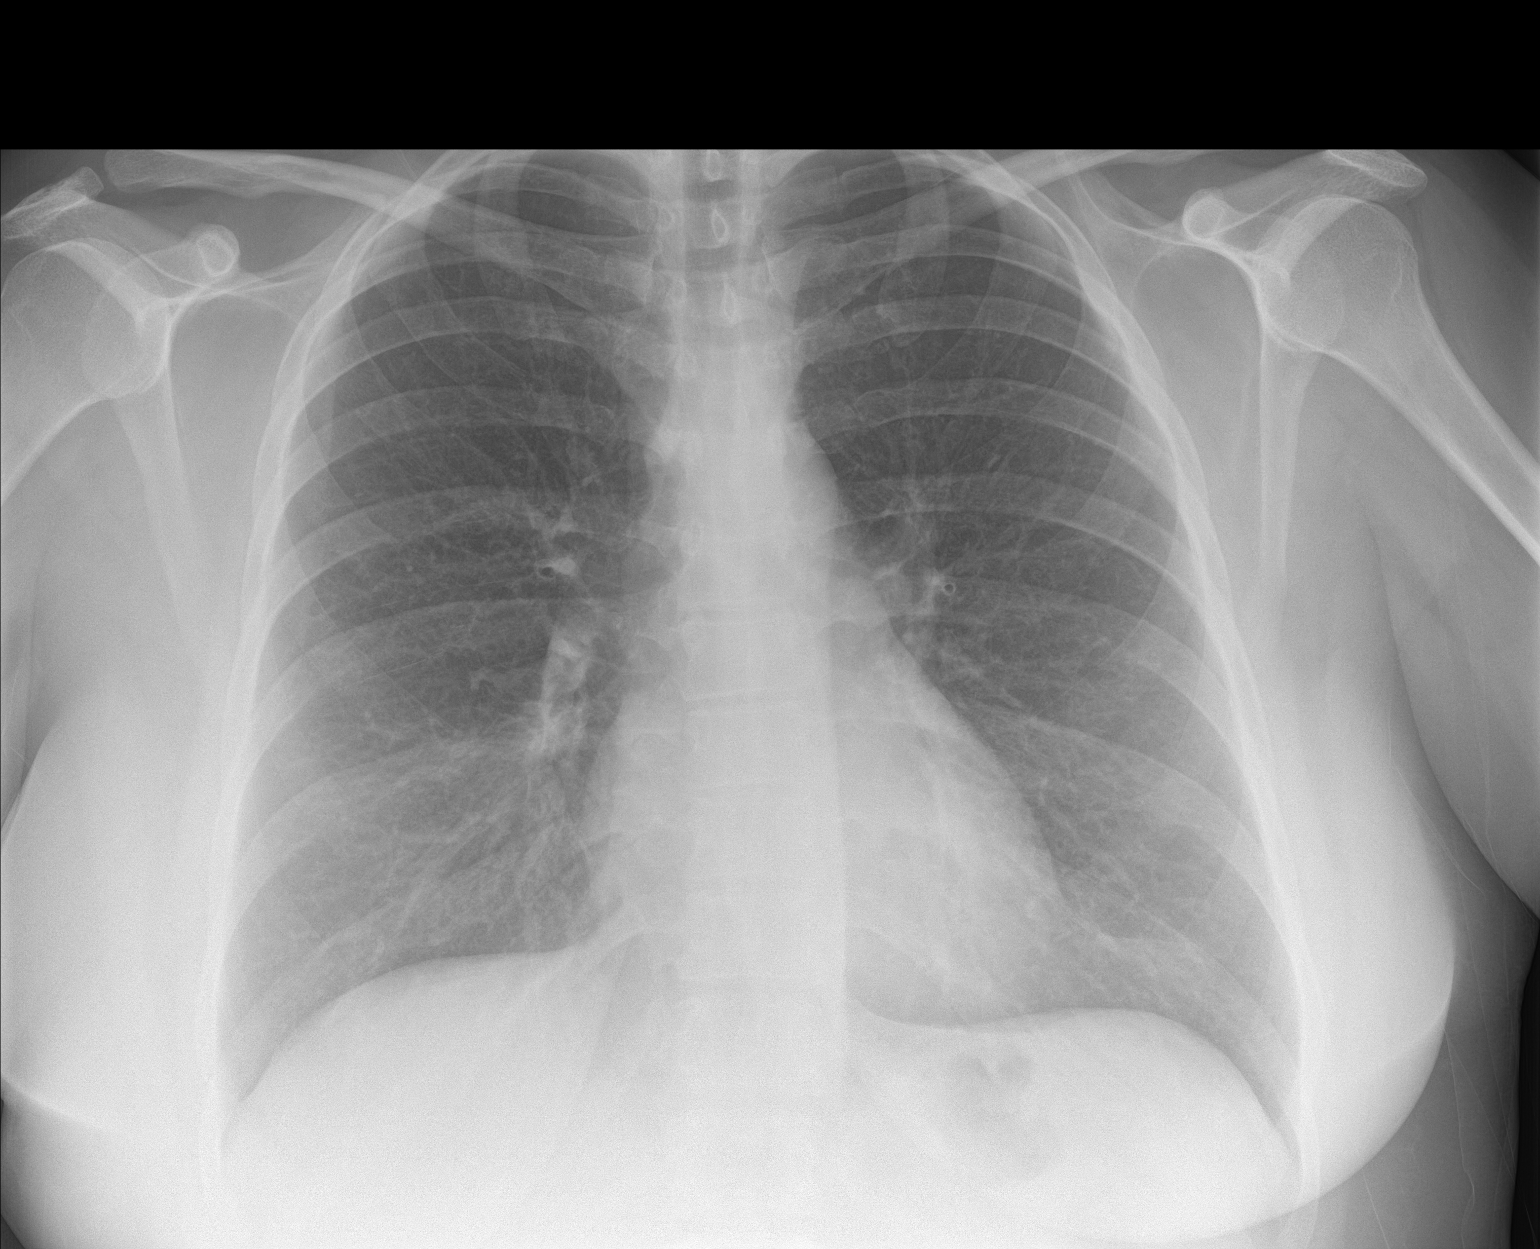

[chest lat]
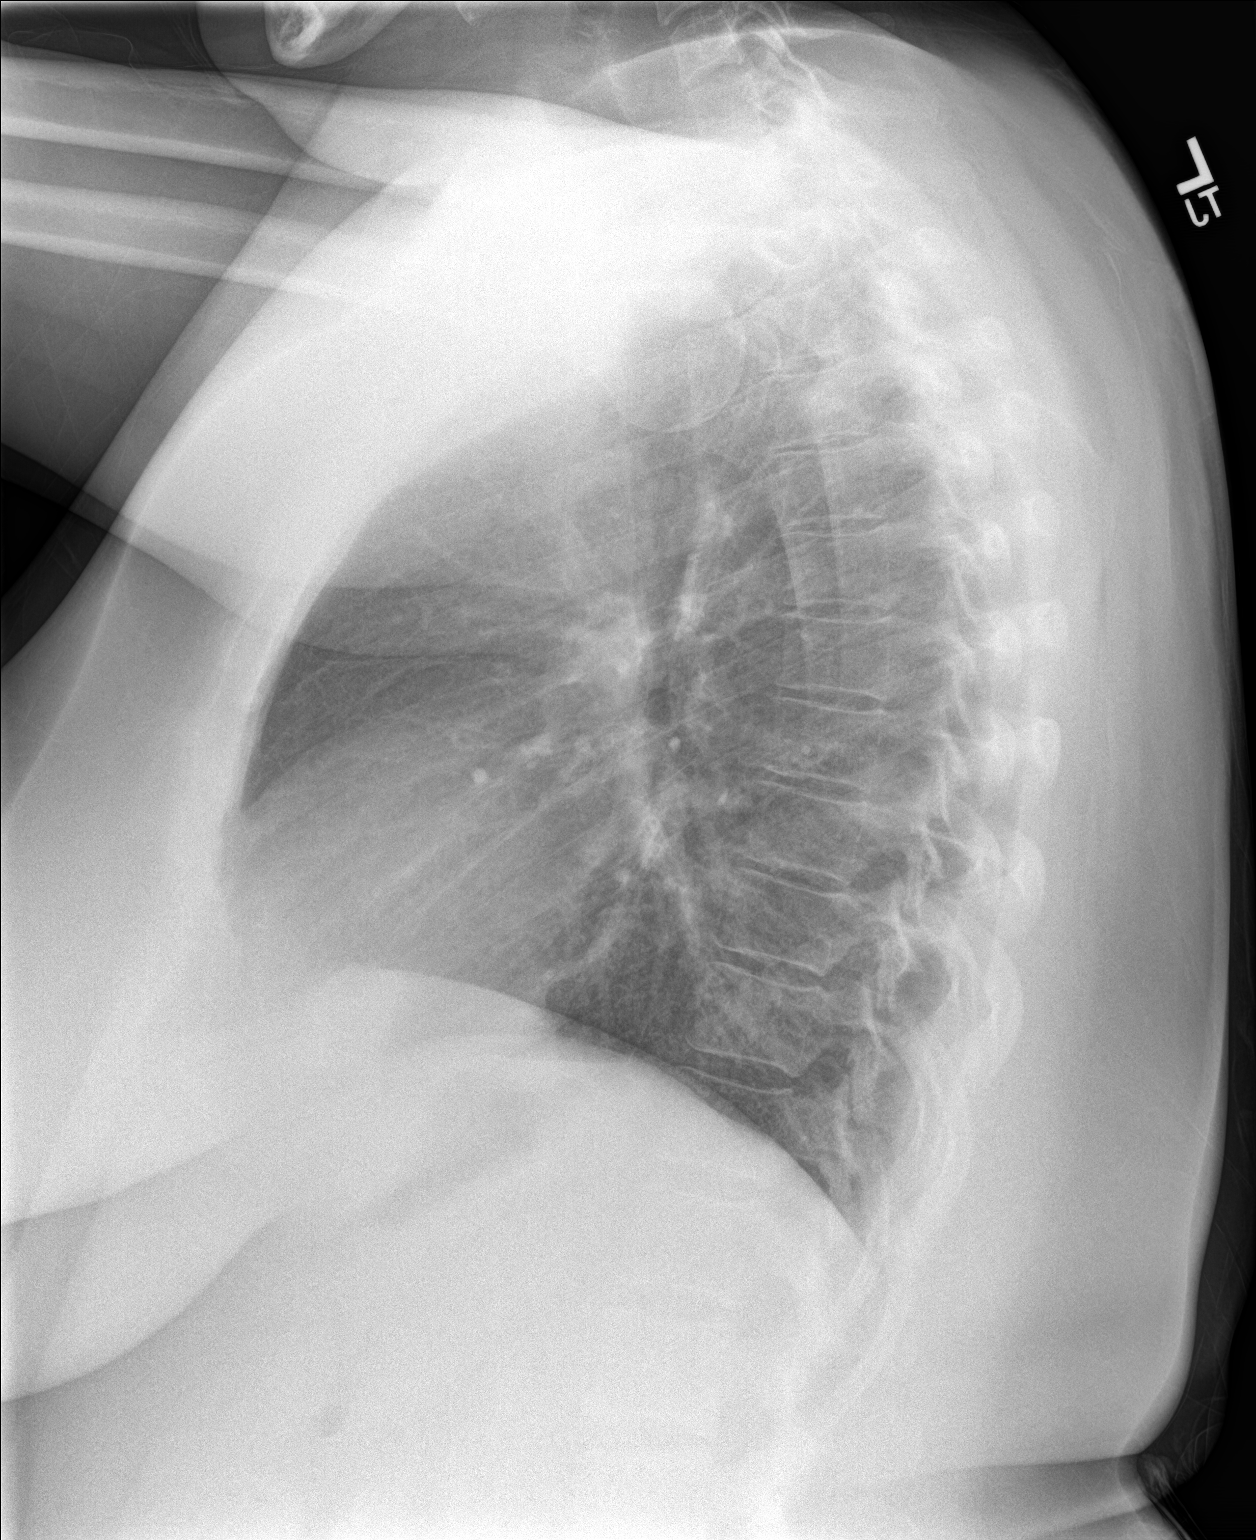

[2 of 2 positions shown; findings below may reference images not displayed]

FINDINGS: Lungs are clear. Heart size and pulmonary vascularity are normal. No
adenopathy. No pneumothorax. No bone lesions.
IMPRESSION: Lungs clear.  Cardiac silhouette within normal limits.

## 2022-09-08 ENCOUNTER — Telehealth: Payer: Self-pay | Admitting: Family Medicine

## 2022-09-08 DIAGNOSIS — J069 Acute upper respiratory infection, unspecified: Secondary | ICD-10-CM

## 2022-09-08 MED ORDER — BENZONATATE 200 MG PO CAPS: 200.0000 mg | ORAL_CAPSULE | Freq: Two times a day (BID) | ORAL | 0 refills | Status: DC | PRN

## 2022-09-08 NOTE — Progress Notes (Signed)
E-Visit for Cough  We are sorry that you are not feeling well.  Here is how we plan to help!  Based on your presentation I believe you most likely have A cough due to a virus.  This is called viral bronchitis and is best treated by rest, plenty of fluids and control of the cough.  You may use Ibuprofen or Tylenol as directed to help your symptoms.     In addition you may use A prescription cough medication called Tessalon Perles 100mg. You may take 1-2 capsules every 8 hours as needed for your cough.    From your responses in the eVisit questionnaire you describe inflammation in the upper respiratory tract which is causing a significant cough.  This is commonly called Bronchitis and has four common causes:   Allergies Viral Infections Acid Reflux Bacterial Infection Allergies, viruses and acid reflux are treated by controlling symptoms or eliminating the cause. An example might be a cough caused by taking certain blood pressure medications. You stop the cough by changing the medication. Another example might be a cough caused by acid reflux. Controlling the reflux helps control the cough.  USE OF BRONCHODILATOR ("RESCUE") INHALERS: There is a risk from using your bronchodilator too frequently.  The risk is that over-reliance on a medication which only relaxes the muscles surrounding the breathing tubes can reduce the effectiveness of medications prescribed to reduce swelling and congestion of the tubes themselves.  Although you feel brief relief from the bronchodilator inhaler, your asthma may actually be worsening with the tubes becoming more swollen and filled with mucus.  This can delay other crucial treatments, such as oral steroid medications. If you need to use a bronchodilator inhaler daily, several times per day, you should discuss this with your provider.  There are probably better treatments that could be used to keep your asthma under control.     HOME CARE Only take medications as  instructed by your medical team. Complete the entire course of an antibiotic. Drink plenty of fluids and get plenty of rest. Avoid close contacts especially the very young and the elderly Cover your mouth if you cough or cough into your sleeve. Always remember to wash your hands A steam or ultrasonic humidifier can help congestion.   GET HELP RIGHT AWAY IF: You develop worsening fever. You become short of breath You cough up blood. Your symptoms persist after you have completed your treatment plan MAKE SURE YOU  Understand these instructions. Will watch your condition. Will get help right away if you are not doing well or get worse.    Thank you for choosing an e-visit.  Your e-visit answers were reviewed by a board certified advanced clinical practitioner to complete your personal care plan. Depending upon the condition, your plan could have included both over the counter or prescription medications.  Please review your pharmacy choice. Make sure the pharmacy is open so you can pick up prescription now. If there is a problem, you may contact your provider through MyChart messaging and have the prescription routed to another pharmacy.  Your safety is important to us. If you have drug allergies check your prescription carefully.   For the next 24 hours you can use MyChart to ask questions about today's visit, request a non-urgent call back, or ask for a work or school excuse. You will get an email in the next two days asking about your experience. I hope that your e-visit has been valuable and will speed your recovery.      have provided 5 minutes of non face to face time during this encounter for chart review and documentation.   

## 2022-09-27 ENCOUNTER — Ambulatory Visit: Payer: Self-pay | Admitting: Nurse Practitioner

## 2022-10-03 ENCOUNTER — Telehealth: Payer: Self-pay | Admitting: Nurse Practitioner

## 2022-10-03 DIAGNOSIS — R110 Nausea: Secondary | ICD-10-CM

## 2022-10-03 MED ORDER — ONDANSETRON 4 MG PO TBDP: 4.0000 mg | ORAL_TABLET | Freq: Three times a day (TID) | ORAL | 0 refills | Status: DC | PRN

## 2022-10-03 NOTE — Progress Notes (Signed)
E-Visit for Nausea and Vomiting   We are sorry that you are not feeling well. Here is how we plan to help!  We do not treat Migraines over E-visits, but we can help you with something for the nausea. We would encourage you to schedule a follow up with your primary care to discuss future migraine management.   I have prescribed a medication that will help alleviate your symptoms and allow you to stay hydrated:  Zofran 4 mg 1 tablet every 8 hours as needed for nausea and vomiting  HOME CARE: Drink clear liquids.  This is very important! Dehydration (the lack of fluid) can lead to a serious complication.  Start off with 1 tablespoon every 5 minutes for 8 hours. You may begin eating bland foods after 8 hours without vomiting.  Start with saltine crackers, white bread, rice, mashed potatoes, applesauce. After 48 hours on a bland diet, you may resume a normal diet. Try to go to sleep.  Sleep often empties the stomach and relieves the need to vomit.  GET HELP RIGHT AWAY IF:  Your symptoms do not improve or worsen within 2 days after treatment. You have a fever for over 3 days. You cannot keep down fluids after trying the medication.  MAKE SURE YOU:  Understand these instructions. Will watch your condition. Will get help right away if you are not doing well or get worse.    Thank you for choosing an e-visit.  Your e-visit answers were reviewed by a board certified advanced clinical practitioner to complete your personal care plan. Depending upon the condition, your plan could have included both over the counter or prescription medications.  Please review your pharmacy choice. Make sure the pharmacy is open so you can pick up prescription now. If there is a problem, you may contact your provider through [employer] and have the prescription routed to another pharmacy.  Your safety is important to [place]. If you have drug allergies check your prescription carefully.   For the next 24 hours  you can use MyChart to ask questions about today's visit, request a non-urgent call back, or ask for a work or school excuse. You will get an email in the next two days asking about your experience. I hope that your e-visit has been valuable and will speed your recovery.   Meds ordered this encounter  Medications   ondansetron (ZOFRAN-ODT) 4 MG disintegrating tablet    Sig: Take 1 tablet (4 mg total) by mouth every 8 (eight) hours as needed for nausea or vomiting.    Dispense:  20 tablet    Refill:  0    I spent approximately 5 minutes reviewing the patient's history, current symptoms and coordinating their care today.

## 2022-10-21 ENCOUNTER — Emergency Department (HOSPITAL_COMMUNITY)
Admission: EM | Admit: 2022-10-21 | Discharge: 2022-10-21 | Disposition: A | Discharge status: Home / Self Care | Payer: Self-pay | Attending: Emergency Medicine | Admitting: Emergency Medicine

## 2022-10-21 ENCOUNTER — Encounter (HOSPITAL_COMMUNITY): Payer: Self-pay

## 2022-10-21 ENCOUNTER — Other Ambulatory Visit: Payer: Self-pay

## 2022-10-21 DIAGNOSIS — X500XXA Overexertion from strenuous movement or load, initial encounter: Secondary | ICD-10-CM | POA: Insufficient documentation

## 2022-10-21 DIAGNOSIS — S39012A Strain of muscle, fascia and tendon of lower back, initial encounter: Secondary | ICD-10-CM | POA: Insufficient documentation

## 2022-10-21 DIAGNOSIS — M5442 Lumbago with sciatica, left side: Secondary | ICD-10-CM | POA: Insufficient documentation

## 2022-10-21 DIAGNOSIS — M5432 Sciatica, left side: Secondary | ICD-10-CM

## 2022-10-21 HISTORY — DX: Dilated cardiomyopathy: I42.0

## 2022-10-21 LAB — URINALYSIS, ROUTINE W REFLEX MICROSCOPIC
Bilirubin Urine: NEGATIVE
Glucose, UA: NEGATIVE mg/dL
Hgb urine dipstick: NEGATIVE
Ketones, ur: NEGATIVE mg/dL
Leukocytes,Ua: NEGATIVE
Nitrite: NEGATIVE
Protein, ur: NEGATIVE mg/dL
Specific Gravity, Urine: 1.009 (ref 1.005–1.030)
pH: 6 (ref 5.0–8.0)

## 2022-10-21 LAB — PREGNANCY, URINE: Preg Test, Ur: NEGATIVE

## 2022-10-21 MED ORDER — LIDOCAINE 5 % EX PTCH: 1.0000 | MEDICATED_PATCH | CUTANEOUS | 0 refills | Status: DC

## 2022-10-21 MED ORDER — METHYLPREDNISOLONE 4 MG PO TBPK: ORAL_TABLET | ORAL | 0 refills | Status: DC

## 2022-10-21 MED ORDER — METHOCARBAMOL 500 MG PO TABS: 500.0000 mg | ORAL_TABLET | Freq: Two times a day (BID) | ORAL | 0 refills | Status: DC

## 2022-10-21 MED ORDER — KETOROLAC TROMETHAMINE 15 MG/ML IJ SOLN
15.0000 mg | Freq: Once | INTRAMUSCULAR | Status: AC
  Administered 2022-10-21: 15 mg via INTRAMUSCULAR
  Filled 2022-10-21: qty 1

## 2022-10-21 MED ORDER — LIDOCAINE 5 % EX PTCH
1.0000 | MEDICATED_PATCH | Freq: Once | CUTANEOUS | Status: DC
  Administered 2022-10-21: 1 via TRANSDERMAL
  Filled 2022-10-21: qty 1

## 2022-10-21 MED ORDER — DEXAMETHASONE SODIUM PHOSPHATE 10 MG/ML IJ SOLN
10.0000 mg | Freq: Once | INTRAMUSCULAR | Status: AC
  Administered 2022-10-21: 10 mg via INTRAMUSCULAR
  Filled 2022-10-21: qty 1

## 2022-10-21 MED ORDER — METHOCARBAMOL 500 MG PO TABS
500.0000 mg | ORAL_TABLET | Freq: Once | ORAL | Status: AC
  Administered 2022-10-21: 500 mg via ORAL
  Filled 2022-10-21: qty 1

## 2022-10-21 NOTE — ED Provider Notes (Signed)
[place] EMERGENCY DEPARTMENT AT [facility] Provider Note   CSN: [id number] Arrival date & time: 10/21/22  4098     History  Chief Complaint  Patient presents with   Back Pain    Bonnie Mathews is a 36 y.o. female who presents to the ED with cocnerns for left lower back pain onset 10/10/22. Doesn't have a PCP at this time. Notes recent heavy lifting with helping her mother move. Tried her familys prescription medications at home for her symptoms. Has a history of UTI, however, denies history of kidney stones. Denies bowel/bladder incontinence, abdominal pain, nausea, vomiting. No history of sciatica.     The history is provided by the patient. No language interpreter was used.       Home Medications Prior to Admission medications   Medication Sig Start Date End Date Taking? Authorizing Provider  lidocaine (LIDODERM) 5 % Place 1 patch onto the skin daily. Remove & Discard patch within 12 hours or as directed by MD 10/21/22  Yes Amor Packard A, PA-C  methocarbamol (ROBAXIN) 500 MG tablet Take 1 tablet (500 mg total) by mouth 2 (two) times daily. 10/21/22  Yes Kadon Andrus A, PA-C  methylPREDNISolone (MEDROL DOSEPAK) 4 MG TBPK tablet Take as directed 10/21/22  Yes Lanisa Ishler A, PA-C  albuterol (VENTOLIN HFA) 108 (90 Base) MCG/ACT inhaler Inhale 2 puffs into the lungs every 4 (four) hours as needed for wheezing or shortness of breath. 12/23/20   Doreene Nest, NP  amitriptyline (ELAVIL) 100 MG tablet Take 1 tablet (100 mg total) by mouth at bedtime. For pain. 06/29/20   Doreene Nest, NP  benzonatate (TESSALON) 200 MG capsule Take 1 capsule (200 mg total) by mouth 2 (two) times daily as needed for cough. 09/08/22   Delorse Lek, FNP  fluticasone-salmeterol (ADVAIR) 100-50 MCG/ACT AEPB Inhale 1 puff into the lungs 2 (two) times daily. 12/23/20   Doreene Nest, NP  ondansetron (ZOFRAN-ODT) 4 MG disintegrating tablet Take 1 tablet (4 mg total) by mouth every 8  (eight) hours as needed for nausea or vomiting. 10/03/22   Viviano Simas, FNP  TRI-SPRINTEC 0.18/0.215/0.25 MG-35 MCG tablet Take 1 tablet by mouth daily. 06/29/20   Doreene Nest, NP  varenicline (CHANTIX CONTINUING MONTH PAK) 1 MG tablet Take 1 tablet (1 mg total) by mouth 2 (two) times daily. 12/23/20   Doreene Nest, NP  varenicline (CHANTIX PAK) 0.5 MG X 11 & 1 MG X 42 tablet Take one 0.5 mg tablet by mouth once daily for 3 days, then increase to one 0.5 mg tablet twice daily for 4 days, then increase to one 1 mg tablet twice daily. 12/23/20   Doreene Nest, NP      Allergies    Doxycycline    Review of Systems   Review of Systems  Musculoskeletal:  Positive for back pain.  All other systems reviewed and are negative.   Physical Exam Updated Vital Signs BP 122/73   Pulse 80   Temp 98.9 F (37.2 C) (Oral)   Resp 14   SpO2 99%  Physical Exam Vitals and nursing note reviewed.  Constitutional:      General: She is not in acute distress.    Appearance: Normal appearance.  Eyes:     General: No scleral icterus.    Extraocular Movements: Extraocular movements intact.  Cardiovascular:     Rate and Rhythm: Normal rate.  Pulmonary:     Effort: Pulmonary effort is normal.  No respiratory distress.  Abdominal:     Palpations: Abdomen is soft. There is no mass.     Tenderness: There is no abdominal tenderness.  Musculoskeletal:        General: Normal range of motion.     Cervical back: Neck supple.     Comments: No spinal tenderness to palpation.  Tenderness to palpation noted to left lumbar musculature.  Positive straight leg raise on the left.  Negative straight leg raise on the right.  Strength sensation intact to bilateral lower extremities.  Able to ambulate without assistance or difficulty.  Skin:    General: Skin is warm and dry.     Findings: No rash.  Neurological:     Mental Status: She is alert.     Sensory: Sensation is intact.     Motor: Motor function is  intact.  Psychiatric:        Behavior: Behavior normal.     ED Results / Procedures / Treatments   Labs (all labs ordered are listed, but only abnormal results are displayed) Labs Reviewed  PREGNANCY, URINE  URINALYSIS, ROUTINE W REFLEX MICROSCOPIC    EKG None  Radiology No results found.  Procedures Procedures    Medications Ordered in ED Medications  lidocaine (LIDODERM) 5 % 1 patch (1 patch Transdermal Patch Applied 10/21/22 1035)  dexamethasone (DECADRON) injection 10 mg (10 mg Intramuscular Given 10/21/22 1036)  ketorolac (TORADOL) 15 MG/ML injection 15 mg (15 mg Intramuscular Given 10/21/22 1036)  methocarbamol (ROBAXIN) tablet 500 mg (500 mg Oral Given 10/21/22 1036)    ED Course/ Medical Decision Making/ A&P Clinical Course as of 10/21/22 1153  Sat Oct 21, 2022  1120 Re-evaluated and noted improvement of symptoms with treatment regimen in the ED.  Discussed with patient discharge treatment plan.  Answered all available questions.  Patient appears safe for discharge at this time. [SB]    Clinical Course User Index [SB] Leanore Biggers A, PA-C                             Medical Decision Making Amount and/or Complexity of Data Reviewed Labs: ordered.  Risk Prescription drug management.   Patient with left lumbar back pain with radiation to left knee. Pt with no history of sciatica. No neurological deficits and normal neuro exam.  Patient is ambulatory without assistance or difficulty.  No loss of bowel or bladder control.  No concern for cauda equina.  No fever, night sweats, weight loss, h/o cancer, IVDA, no recent procedure to back. No urinary symptoms suggestive of UTI.  Vital signs, patient afebrile. On exam, patient with No spinal tenderness to palpation.  Tenderness to palpation noted to left lumbar musculature.  Positive straight leg raise on the left.  Negative straight leg raise on the right.  Strength sensation intact to bilateral lower extremities.  Able  to ambulate without assistance or difficulty. No concerning findings on exams. Differential diagnosis includes but is not limited to fracture, herniation, muscle strain, cauda equina, pancreatitis, appendicitis, pyelonephritis, nephrolithiasis.    Labs:  I ordered, and personally interpreted labs.  The pertinent results include:   Urinalysis unremarkable Negative pregnancy urine   Medications:  I ordered medication including toradol, Decadron, warm compress, robaxin, lidoderm patch for pain management Reevaluation of the patient after these medicines and interventions, I reevaluated the patient and found that they have improved I have reviewed the patients home medicines and have made adjustments as needed  Disposition: Presentation suspicious for sciatica and lumbar strain.  Doubt concerns this time for fracture or herniation. After consideration of the diagnostic results and the patients response to treatment, I feel that the patient would benefit from Discharge home. Work note provided. Will send patient home with prescription for Medrol Dosepak, Robaxin, Lidoderm patch.  Patient provided with information for orthopedist to follow-up if symptoms persist. Discussed importance of follow-up with primary care provider for management.  Supportive care measures and strict return precautions discussed with patient at bedside. Pt acknowledges and verbalizes understanding. Pt appears safe for discharge. Follow up as indicated in discharge paperwork.    This chart was dictated using voice recognition software, Dragon. Despite the best efforts of this provider to proofread and correct errors, errors may still occur which can change documentation meaning.   Final Clinical Impression(s) / ED Diagnoses Final diagnoses:  Strain of lumbar region, initial encounter  Sciatica of left side    Rx / DC Orders ED Discharge Orders          Ordered    methocarbamol (ROBAXIN) 500 MG tablet  2 times  daily        10/21/22 1123    lidocaine (LIDODERM) 5 %  Every 24 hours        10/21/22 1123    methylPREDNISolone (MEDROL DOSEPAK) 4 MG TBPK tablet        10/21/22 1130              Okla Qazi A, PA-C 10/21/22 1153    Terald Sleeper, MD 10/21/22 2010

## 2022-10-21 NOTE — ED Triage Notes (Signed)
Pt c/o L low back pain since 6/18.  Denies injury.  Pt reports pain shoot down L leg w/ exertion.  Pt has taken other's prescription pain medication and tried OTC treatments w/o relief.

## 2022-10-21 NOTE — Discharge Instructions (Addendum)
It was a pleasure taking care of you today!   Your urine didn't show any concerning findings at this time. Attached is information for the on-call orthopedist, call and set up a follow up appointment regarding todays ED visit as needed if your symptoms persist for more than 2 weeks. You are prescribed Robaxin (muscle relaxer), Medrol dosepak (steroid), lidoderm patches. Take medications as prescribed.  Do not operate any heavy machinery or drive while taking Robaxin as it can make you sleepy/drowsy.  You will also be sent a prescription for lidoderm patch, remove and replace with a new patch every 12 hours. You may apply heat to the affected area for up to 15 minutes at a time.  Ensure to place a barrier between your skin and the heat. Attached is information for [facility] to establish care with a primary care provider. Return to the Emergency Department if you are experiencing loss of bowel or bladder, increasing/worsening symptoms, fever, inability to walk.

## 2023-01-31 ENCOUNTER — Encounter (HOSPITAL_COMMUNITY): Payer: Self-pay | Admitting: Emergency Medicine

## 2023-01-31 ENCOUNTER — Emergency Department (HOSPITAL_COMMUNITY)
Admission: EM | Admit: 2023-01-31 | Discharge: 2023-02-01 | Disposition: A | Discharge status: Hospice | Payer: Self-pay | Attending: Emergency Medicine | Admitting: Emergency Medicine

## 2023-01-31 DIAGNOSIS — R1011 Right upper quadrant pain: Secondary | ICD-10-CM | POA: Insufficient documentation

## 2023-01-31 DIAGNOSIS — N939 Abnormal uterine and vaginal bleeding, unspecified: Secondary | ICD-10-CM | POA: Insufficient documentation

## 2023-01-31 LAB — CBC
HCT: 43.8 % (ref 36.0–46.0)
Hemoglobin: 14 g/dL (ref 12.0–15.0)
MCH: 29.8 pg (ref 26.0–34.0)
MCHC: 32 g/dL (ref 30.0–36.0)
MCV: 93.2 fL (ref 80.0–100.0)
Platelets: 292 10*3/uL (ref 150–400)
RBC: 4.7 MIL/uL (ref 3.87–5.11)
RDW: 12.9 % (ref 11.5–15.5)
WBC: 9.6 10*3/uL (ref 4.0–10.5)
nRBC: 0 % (ref 0.0–0.2)

## 2023-01-31 LAB — URINALYSIS, ROUTINE W REFLEX MICROSCOPIC
Bilirubin Urine: NEGATIVE
Glucose, UA: NEGATIVE mg/dL
Ketones, ur: 20 mg/dL — AB
Nitrite: NEGATIVE
Protein, ur: 30 mg/dL — AB
Specific Gravity, Urine: 1.029 (ref 1.005–1.030)
pH: 5 (ref 5.0–8.0)

## 2023-01-31 LAB — COMPREHENSIVE METABOLIC PANEL
ALT: 14 U/L (ref 0–44)
AST: 14 U/L — ABNORMAL LOW (ref 15–41)
Albumin: 4.3 g/dL (ref 3.5–5.0)
Alkaline Phosphatase: 57 U/L (ref 38–126)
Anion gap: 13 (ref 5–15)
BUN: 17 mg/dL (ref 6–20)
CO2: 23 mmol/L (ref 22–32)
Calcium: 9.8 mg/dL (ref 8.9–10.3)
Chloride: 107 mmol/L (ref 98–111)
Creatinine, Ser: 0.6 mg/dL (ref 0.44–1.00)
GFR, Estimated: >60 mL/min (ref >60)
Glucose, Bld: 94 mg/dL (ref 70–99)
Potassium: 4.4 mmol/L (ref 3.5–5.1)
Sodium: 143 mmol/L (ref 135–145)
Total Bilirubin: 1 mg/dL (ref 0.3–1.2)
Total Protein: 7.8 g/dL (ref 6.5–8.1)

## 2023-01-31 LAB — LIPASE, BLOOD: Lipase: 23 U/L (ref 11–51)

## 2023-01-31 LAB — HCG, SERUM, QUALITATIVE: Preg, Serum: NEGATIVE

## 2023-01-31 NOTE — ED Triage Notes (Addendum)
RUQ pain since Monday. Nothing makes pain better or worse- is not reproducible. LBM was today. On menstrual period. Denies nausea, vomiting, diarrhea. Has appendix and gallbladder.

## 2023-02-01 ENCOUNTER — Emergency Department (HOSPITAL_COMMUNITY): Payer: Self-pay

## 2023-02-01 MED ORDER — DICYCLOMINE HCL 20 MG PO TABS: 20.0000 mg | ORAL_TABLET | Freq: Three times a day (TID) | ORAL | 0 refills | Status: DC | PRN

## 2023-02-01 MED ORDER — MAALOX MAX 400-400-40 MG/5ML PO SUSP: 10.0000 mL | Freq: Four times a day (QID) | ORAL | 0 refills | Status: DC | PRN

## 2023-02-01 MED ORDER — PANTOPRAZOLE SODIUM 40 MG PO TBEC: 40.0000 mg | DELAYED_RELEASE_TABLET | Freq: Every day | ORAL | 0 refills | Status: DC

## 2023-02-01 MED ORDER — DICYCLOMINE HCL 10 MG PO CAPS
10.0000 mg | ORAL_CAPSULE | Freq: Once | ORAL | Status: AC
  Administered 2023-02-01: 10 mg via ORAL
  Filled 2023-02-01: qty 1

## 2023-02-01 MED ORDER — ONDANSETRON 4 MG PO TBDP
8.0000 mg | ORAL_TABLET | Freq: Once | ORAL | Status: AC
  Administered 2023-02-01: 8 mg via ORAL
  Filled 2023-02-01: qty 2

## 2023-02-01 MED ORDER — HYDROCODONE-ACETAMINOPHEN 5-325 MG PO TABS
2.0000 | ORAL_TABLET | Freq: Once | ORAL | Status: AC
  Administered 2023-02-01: 2 via ORAL
  Filled 2023-02-01: qty 2

## 2023-02-01 NOTE — Discharge Instructions (Signed)

## 2023-02-01 NOTE — ED Provider Notes (Signed)
[place] EMERGENCY DEPARTMENT AT [facility] Provider Note   CSN: [id number] Arrival date & time: 01/31/23  2030     History  Chief Complaint  Patient presents with   Abdominal Pain    Bonnie Mathews is a 37 y.o. female.   Abdominal Pain Associated symptoms: vaginal bleeding   Associated symptoms: no chest pain, no fever and no vomiting    Patient presents for right upper quadrant abdominal pain  She has had intermittent right upper quadrant pain for the past 4 days.  It is worse with movement and palpation.  It is not worse with eating.  No fevers or vomiting or diarrhea.   No chest pain or shortness of breath.  She has never had this pain before Only previous surgeries were related to pregnancies. She reports distant history of dilated cardiomyopathy without any recent issues Past Medical History:  Diagnosis Date   Chickenpox    Dilated cardiomyopathy (HCC)    GERD (gastroesophageal reflux disease)    Peptic ulcer    Tobacco abuse     Home Medications Prior to Admission medications   Medication Sig Start Date End Date Taking? Authorizing Provider  albuterol (VENTOLIN HFA) 108 (90 Base) MCG/ACT inhaler Inhale 2 puffs into the lungs every 4 (four) hours as needed for wheezing or shortness of breath. 12/23/20   Doreene Nest, NP  amitriptyline (ELAVIL) 100 MG tablet Take 1 tablet (100 mg total) by mouth at bedtime. For pain. 06/29/20   Doreene Nest, NP  fluticasone-salmeterol (ADVAIR) 100-50 MCG/ACT AEPB Inhale 1 puff into the lungs 2 (two) times daily. 12/23/20   Doreene Nest, NP  lidocaine (LIDODERM) 5 % Place 1 patch onto the skin daily. Remove & Discard patch within 12 hours or as directed by MD 10/21/22   Blue, Soijett A, PA-C  TRI-SPRINTEC 0.18/0.215/0.25 MG-35 MCG tablet Take 1 tablet by mouth daily. 06/29/20   Doreene Nest, NP  varenicline (CHANTIX CONTINUING MONTH PAK) 1 MG tablet Take 1 tablet (1 mg total) by mouth 2 (two) times daily.  12/23/20   Doreene Nest, NP  varenicline (CHANTIX PAK) 0.5 MG X 11 & 1 MG X 42 tablet Take one 0.5 mg tablet by mouth once daily for 3 days, then increase to one 0.5 mg tablet twice daily for 4 days, then increase to one 1 mg tablet twice daily. 12/23/20   Doreene Nest, NP      Allergies    Doxycycline    Review of Systems   Review of Systems  Constitutional:  Negative for fever.  Cardiovascular:  Negative for chest pain.  Gastrointestinal:  Positive for abdominal pain. Negative for vomiting.  Genitourinary:  Positive for vaginal bleeding.       Patient currently on menstrual cycle    Physical Exam Updated Vital Signs BP 112/79 (BP Location: Left Arm)   Pulse 84   Temp 97.8 F (36.6 C) (Oral)   Resp 14   LMP 01/31/2023   SpO2 99%  Physical Exam CONSTITUTIONAL: Well developed/well nourished HEAD: Normocephalic/atraumatic EYES: EOMI/PERRL, no icterus ENMT: Mucous membranes moist NECK: supple no meningeal signs CV: S1/S2 noted, no murmurs/rubs/gallops noted LUNGS: Lungs are clear to auscultation bilaterally, no apparent distress ABDOMEN: soft, moderate right upper quadrant tenderness, no rebound or guarding, bowel sounds noted throughout abdomen GU:no cva tenderness NEURO: Pt is awake/alert/appropriate, moves all extremitiesx4.  No facial droop.   EXTREMITIES: pulses normal/equal, full ROM SKIN: warm, color normal PSYCH: no abnormalities  of mood noted, alert and oriented to situation  ED Results / Procedures / Treatments   Labs (all labs ordered are listed, but only abnormal results are displayed) Labs Reviewed  COMPREHENSIVE METABOLIC PANEL - Abnormal; Notable for the following components:      Result Value   AST 14 (*)    All other components within normal limits  URINALYSIS, ROUTINE W REFLEX MICROSCOPIC - Abnormal; Notable for the following components:   Color, Urine AMBER (*)    APPearance CLOUDY (*)    Hgb urine dipstick LARGE (*)    Ketones, ur 20 (*)     Protein, ur 30 (*)    Leukocytes,Ua MODERATE (*)    Bacteria, UA FEW (*)    All other components within normal limits  LIPASE, BLOOD  CBC  HCG, SERUM, QUALITATIVE    EKG None  Radiology No results found.  Procedures Procedures    Medications Ordered in ED Medications  HYDROcodone-acetaminophen (NORCO/VICODIN) 5-325 MG per tablet 2 tablet (2 tablets Oral Given 02/01/23 0659)  ondansetron (ZOFRAN-ODT) disintegrating tablet 8 mg (8 mg Oral Given 02/01/23 0701)    ED Course/ Medical Decision Making/ A&P Clinical Course as of 02/01/23 0706  Thu Feb 01, 2023  0706 D/w dr long with  RUQ [place] pending [DW]    Clinical Course User Index [DW] Zadie Rhine, MD                                 Medical Decision Making Amount and/or Complexity of Data Reviewed Labs: ordered. Radiology: ordered.  Risk Prescription drug management.   This patient presents to the ED for concern of right upper quadrant pain, this involves an extensive number of treatment options, and is a complaint that carries with it a high risk of complications and morbidity.  The differential diagnosis includes but is not limited to cholecystitis, cholelithiasis, pancreatitis, gastritis, peptic ulcer disease, appendicitis, bowel obstruction, bowel perforation, diverticulitis, AAA, ischemic bowel Ectopic pregnancy, tubo-ovarian abscess, PID, ACS    Social Determinants of Health: Patient's  vaping use   increases the complexity of managing their presentation  Lab Tests: I Ordered, and personally interpreted labs.  The pertinent results include: Labs overall unremarkable  Imaging Studies ordered: I ordered imaging studies including Ultrasound right upper quadrant ultrasound     Medicines ordered and prescription drug management: I ordered medication including Vicodin for pain  Complexity of problems addressed: Patient's presentation is most consistent with  acute presentation with potential threat to  life or bodily function            Final Clinical Impression(s) / ED Diagnoses Final diagnoses:  RUQ pain    Rx / DC Orders ED Discharge Orders     None         Zadie Rhine, MD 02/01/23 [id number]

## 2023-02-01 NOTE — ED Provider Notes (Signed)
Blood pressure 112/79, pulse 84, temperature 97.8 F (36.6 C), temperature source Oral, resp. rate 14, last menstrual period 01/31/2023, SpO2 99%.  Assuming care from Dr. Bebe Shaggy.  In short, Bonnie Mathews is a 37 y.o. female with a chief complaint of Abdominal Pain .  Refer to the original H&P for additional details.  The current plan of care is to follow up with RUQ [place].  09:00 AM  I independently interpreted the right upper quadrant ultrasound and agree with radiology impression.  Patient feeling improved with Bentyl.  Unclear if this is musculoskeletal versus PUD versus gastritis.  Gallbladder ultrasound largely unremarkable.  I will start her on PPI along with Bentyl and a muscle relaxer to help with symptoms at night.  Cautioned to not drive while taking the muscle relaxing medicine due to drowsiness.    Maia Plan, MD 02/01/23 [phone]

## 2023-05-15 ENCOUNTER — Telehealth: Payer: Self-pay

## 2023-05-15 ENCOUNTER — Encounter: Payer: Self-pay | Admitting: Primary Care

## 2023-05-15 ENCOUNTER — Ambulatory Visit (INDEPENDENT_AMBULATORY_CARE_PROVIDER_SITE_OTHER): Payer: Managed Care, Other (non HMO) | Admitting: Primary Care

## 2023-05-15 ENCOUNTER — Other Ambulatory Visit (HOSPITAL_COMMUNITY)
Admission: RE | Admit: 2023-05-15 | Discharge: 2023-05-15 | Disposition: A | Discharge status: Home / Self Care | Payer: Managed Care, Other (non HMO) | Source: Ambulatory Visit | Attending: Primary Care | Admitting: Primary Care

## 2023-05-15 VITALS — BP 98/60 | HR 81 | Temp 98.1°F | Ht 63.0 in | Wt 198.0 lb

## 2023-05-15 DIAGNOSIS — Z13220 Encounter for screening for lipoid disorders: Secondary | ICD-10-CM

## 2023-05-15 DIAGNOSIS — Z131 Encounter for screening for diabetes mellitus: Secondary | ICD-10-CM

## 2023-05-15 DIAGNOSIS — F411 Generalized anxiety disorder: Secondary | ICD-10-CM | POA: Insufficient documentation

## 2023-05-15 DIAGNOSIS — M25511 Pain in right shoulder: Secondary | ICD-10-CM | POA: Diagnosis not present

## 2023-05-15 DIAGNOSIS — Z0001 Encounter for general adult medical examination with abnormal findings: Secondary | ICD-10-CM

## 2023-05-15 DIAGNOSIS — J4540 Moderate persistent asthma, uncomplicated: Secondary | ICD-10-CM | POA: Diagnosis not present

## 2023-05-15 DIAGNOSIS — Z124 Encounter for screening for malignant neoplasm of cervix: Secondary | ICD-10-CM

## 2023-05-15 DIAGNOSIS — K219 Gastro-esophageal reflux disease without esophagitis: Secondary | ICD-10-CM

## 2023-05-15 DIAGNOSIS — G8929 Other chronic pain: Secondary | ICD-10-CM

## 2023-05-15 LAB — LIPID PANEL
Cholesterol: 157 mg/dL (ref 0–200)
HDL: 62.8 mg/dL (ref >39.00)
LDL Cholesterol: 85 mg/dL (ref 0–99)
NonHDL: 93.89
Total CHOL/HDL Ratio: 2
Triglycerides: 46 mg/dL (ref 0.0–149.0)
VLDL: 9.2 mg/dL (ref 0.0–40.0)

## 2023-05-15 LAB — HEMOGLOBIN A1C: Hgb A1c MFr Bld: 5.5 % (ref 4.6–6.5)

## 2023-05-15 MED ORDER — PREDNISONE 20 MG PO TABS
ORAL_TABLET | ORAL | 0 refills | Status: DC
Start: 2023-05-15 — End: 2023-06-13

## 2023-05-15 MED ORDER — ALBUTEROL SULFATE HFA 108 (90 BASE) MCG/ACT IN AERS
2.0000 | INHALATION_SPRAY | RESPIRATORY_TRACT | 0 refills | Status: DC | PRN
Start: 2023-05-15 — End: 2023-05-16

## 2023-05-15 MED ORDER — ESCITALOPRAM OXALATE 10 MG PO TABS
10.0000 mg | ORAL_TABLET | Freq: Every day | ORAL | 0 refills | Status: DC
Start: 2023-05-15 — End: 2023-07-17

## 2023-05-15 MED ORDER — FLUTICASONE-SALMETEROL 100-50 MCG/ACT IN AEPB
1.0000 | INHALATION_SPRAY | Freq: Two times a day (BID) | RESPIRATORY_TRACT | 11 refills | Status: DC
Start: 2023-05-15 — End: 2023-11-20

## 2023-05-15 NOTE — Assessment & Plan Note (Signed)
Uncontrolled.  Discussed options today.  She opts for medication.  Start Lexapro 10 mg daily.  Take 1/2 tablet by mouth daily x 1 week, then increase to 1 full tablet thereafter.  We discussed potential side effects and instructions for initiation.  Close follow-up in 6 weeks.

## 2023-05-15 NOTE — Assessment & Plan Note (Signed)
Improved.   Continue stretching.

## 2023-05-15 NOTE — Assessment & Plan Note (Signed)
Immunizations UTD. Influenza vaccine provided today.  Pap smear due, completed today.   Discussed the importance of a healthy diet and regular exercise in order for weight loss, and to reduce the risk of further co-morbidity.  Exam stable. Labs pending.  Follow up in 1 year for repeat physical.  

## 2023-05-15 NOTE — Assessment & Plan Note (Signed)
Uncontrolled with inappropriate SABA use.  Start prednisone 20 mg tablets. Take 2 tablets by mouth once daily in the morning for 5 days. Start Advair 100-50 1 puff BID.  She will update.

## 2023-05-15 NOTE — Patient Instructions (Addendum)
Resume your Advair inhaler. Inhale 1 puff into the lungs twice daily, everyday. Rinse your mouth after each use.   Start Lexapro 10 mg for anxiety and depression. Take 1/2 tablet by mouth once daily for about one week, then increase to 1 full tablet thereafter.   Start prednisone 20 mg tablets. Take 2 tablets by mouth once daily in the morning for 5 days.  Stop by the lab prior to leaving today. I will notify you of your results once received.   Please schedule a follow up visit for 6 weeks for follow up of anxiety/depression.  It was a pleasure to see you today!

## 2023-05-15 NOTE — Progress Notes (Signed)
Subjective:    Patient ID: Bonnie Mathews, female    DOB: 05-24-1985, 38 y.o.   MRN: [id number]  HPI  Bonnie Mathews is a very pleasant 38 y.o. female who presents today for complete physical and follow up of chronic conditions.  She would also like to discuss shortness of breath. Over the last 2 weeks she began noticing exertional shortness of breath, chest tightness, wheezing. She's been using her albuterol inhaler 2-3 times daily. Prior to symptom onset she was using her albuterol inhaler daily. She ran out of her Susa Simmonds >1 year ago.   She would also like ot discuss chronic anxiety. Chronic with symptoms of palpitations, irritability, little interested in doing things, worrying, mind racing thoughts, over thinking things. Anxiety symptoms occur daily. She is current taking substance abuse classes for prior alcoholism. Has been sober for the last 2 years. This has helped.   Immunizations: -Tetanus: Completed in 2017 -Influenza: Influenza vaccine provided today.   Diet: Fair diet.  Exercise: No regular exercise.  Eye exam: Completed years ago  Dental exam: Completed years ago  Pap Smear: Completed in 2020  BP Readings from Last 3 Encounters:  05/15/23 98/60  02/01/23 112/79  10/21/22 122/73       Review of Systems  Constitutional:  Negative for unexpected weight change.  HENT:  Negative for rhinorrhea.   Respiratory:  Positive for chest tightness, shortness of breath and wheezing. Negative for cough.   Cardiovascular:  Negative for chest pain.  Gastrointestinal:  Negative for constipation and diarrhea.  Genitourinary:  Negative for difficulty urinating and menstrual problem.  Musculoskeletal:  Negative for arthralgias and myalgias.  Skin:  Negative for rash.  Allergic/Immunologic: Negative for environmental allergies.  Neurological:  Negative for dizziness, numbness and headaches.  Psychiatric/Behavioral:  The patient is nervous/anxious.          Past Medical  History:  Diagnosis Date   Chickenpox    Chronic cough 07/15/2019   Dilated cardiomyopathy (HCC)    GERD (gastroesophageal reflux disease)    Numbness and tingling of right lower extremity 07/15/2019   Numbness and tingling of right upper extremity 06/09/2015   Peptic ulcer    Postcoital bleeding 12/23/2018   Present since 2016.    Normal exam   TSH, A1c drawn   Pap done   Change to low dose OCPs     Shortness of breath 07/15/2019   Tobacco abuse     Social History   Socioeconomic History   Marital status: Single    Spouse name: Not on file   Number of children: Not on file   Years of education: Not on file   Highest education level: GED or equivalent  Occupational History   Not on file  Tobacco Use   Smoking status: Former    Current packs/day: 0.50    Average packs/day: 0.5 packs/day for 17.0 years (8.5 ttl pk-yrs)    Types: Cigarettes   Smokeless tobacco: Never  Vaping Use   Vaping status: Every Day  Substance and Sexual Activity   Alcohol use: Not Currently    Comment: Occ   Drug use: Yes    Types: Marijuana    Comment: Daily   Sexual activity: Not on file  Other Topics Concern   Not on file  Social History Narrative   Single.   1 child.   Works as a Production designer, theatre/television/film at [employer]   Enjoys working out and watching TV.   She's working on her GED.  Social Drivers of Health   Financial Resource Strain: Medium Risk (05/11/2023)   Overall Financial Resource Strain (CARDIA)    Difficulty of Paying Living Expenses: Somewhat hard  Food Insecurity: Food Insecurity Present (05/11/2023)   Hunger Vital Sign    Worried About Running Out of Food in the Last Year: Sometimes true    Ran Out of Food in the Last Year: Sometimes true  Transportation Needs: No Transportation Needs (05/11/2023)   PRAPARE - Administrator, Civil Service (Medical): No    Lack of Transportation (Non-Medical): No  Physical Activity: Sufficiently Active (05/11/2023)   Exercise Vital Sign     Days of Exercise per Week: 3 days    Minutes of Exercise per Session: 60 min  Stress: Stress Concern Present (05/11/2023)   [employer] of Occupational Health - Occupational Stress Questionnaire    Feeling of Stress : Very much  Social Connections: Socially Isolated (05/11/2023)   Social Connection and Isolation Panel [NHANES]    Frequency of Communication with Friends and Family: More than three times a week    Frequency of Social Gatherings with Friends and Family: Twice a week    Attends Religious Services: Never    Database administrator or Organizations: No    Attends Engineer, structural: Not on file    Marital Status: Divorced  Catering manager Violence: Not on file    Past Surgical History:  Procedure Laterality Date   FOOT SURGERY Right 2020   lipoma removed from arch   TONSILLECTOMY AND ADENOIDECTOMY  1993    Family History  Problem Relation Age of Onset   Alcohol abuse Paternal Grandfather    Mental illness Paternal Grandfather    Arthritis Mother    Heart disease Mother    Diabetes Mother    Hypertension Father    Arthritis Maternal Aunt    Breast cancer Maternal Aunt    Alcohol abuse Paternal Uncle    Mental illness Maternal Grandmother    Mental illness Maternal Grandfather    Colon cancer Paternal Grandmother    Mental illness Paternal Grandmother     Allergies  Allergen Reactions   Doxycycline Shortness Of Breath    No current outpatient medications on file prior to visit.   No current facility-administered medications on file prior to visit.    BP 98/60   Pulse 81   Temp 98.1 F (36.7 C) (Temporal)   Ht 5\' 3"  (1.6 m)   Wt 198 lb (89.8 kg)   LMP 05/10/2023   SpO2 99%   BMI 35.07 kg/m  Objective:   Physical Exam Exam conducted with a chaperone present.  HENT:     Right Ear: Tympanic membrane and ear canal normal.     Left Ear: Tympanic membrane and ear canal normal.  Eyes:     Pupils: Pupils are equal, round, and  reactive to light.  Cardiovascular:     Rate and Rhythm: Normal rate and regular rhythm.  Pulmonary:     Effort: Pulmonary effort is normal.     Breath sounds: Normal breath sounds.  Abdominal:     General: Bowel sounds are normal.     Palpations: Abdomen is soft.     Tenderness: There is no abdominal tenderness.  Genitourinary:    Labia:        Right: No rash, tenderness or lesion.        Left: No rash, tenderness or lesion.      Vagina: Normal.  Cervix: Normal.     Uterus: Normal.      Adnexa: Right adnexa normal and left adnexa normal.  Musculoskeletal:        General: Normal range of motion.     Cervical back: Neck supple.  Skin:    General: Skin is warm and dry.  Neurological:     Mental Status: She is alert and oriented to person, place, and time.     Cranial Nerves: No cranial nerve deficit.     Deep Tendon Reflexes:     Reflex Scores:      Patellar reflexes are 2+ on the right side and 2+ on the left side. Psychiatric:        Mood and Affect: Mood normal.           Assessment & Plan:  Encounter for annual general medical examination with abnormal findings in adult Assessment & Plan: Immunizations UTD. Influenza vaccine provided today.  Pap smear due, completed today  Discussed the importance of a healthy diet and regular exercise in order for weight loss, and to reduce the risk of further co-morbidity.  Exam stable. Labs pending.  Follow up in 1 year for repeat physical.   Orders: -     Hemoglobin A1c -     Lipid panel  Moderate persistent asthma without complication Assessment & Plan: Uncontrolled with inappropriate SABA use.  Start prednisone 20 mg tablets. Take 2 tablets by mouth once daily in the morning for 5 days. Start Advair 100-50 1 puff BID.  She will update.   Orders: -     Fluticasone-Salmeterol; Inhale 1 puff into the lungs 2 (two) times daily.  Dispense: 60 each; Refill: 11 -     Albuterol Sulfate HFA; Inhale 2 puffs into  the lungs every 4 (four) hours as needed for wheezing or shortness of breath.  Dispense: 18 g; Refill: 0 -     predniSONE; Take 2 tablets by mouth once daily in the morning for 5 days.  Dispense: 10 tablet; Refill: 0  Gastroesophageal reflux disease, unspecified whether esophagitis present Assessment & Plan: Controlled.  Continue famotidine 20 mg PRN.   Chronic right shoulder pain Assessment & Plan: Improved.   Continue stretching.    Screening for cervical cancer -     Cytology - PAP  GAD (generalized anxiety disorder) Assessment & Plan: Uncontrolled.  Discussed options today.  She opts for medication.  Start Lexapro 10 mg daily.  Take 1/2 tablet by mouth daily x 1 week, then increase to 1 full tablet thereafter.  We discussed potential side effects and instructions for initiation.  Close follow-up in 6 weeks.  Orders: -     Escitalopram Oxalate; Take 1 tablet (10 mg total) by mouth daily. for anxiety  Dispense: 90 tablet; Refill: 0        Doreene Nest, NP

## 2023-05-15 NOTE — Assessment & Plan Note (Signed)
Controlled.  Continue famotidine 20 mg PRN. 

## 2023-05-15 NOTE — Telephone Encounter (Signed)
Received notification from [facility] pharmacy that patients insurance will not cover Ventolin. They will cover Albuterol 7gr generic for Proventil. They are requesting a new script be sent in.

## 2023-05-16 MED ORDER — ALBUTEROL SULFATE HFA 108 (90 BASE) MCG/ACT IN AERS
1.0000 | INHALATION_SPRAY | Freq: Four times a day (QID) | RESPIRATORY_TRACT | 0 refills | Status: DC | PRN
Start: 2023-05-16 — End: 2023-10-27

## 2023-05-16 NOTE — Telephone Encounter (Signed)
Noted, new Rx sent to pharmacy. 

## 2023-05-16 NOTE — Addendum Note (Signed)
Addended by: Doreene Nest on: 05/16/2023 07:51 AM   Modules accepted: Orders

## 2023-05-17 ENCOUNTER — Other Ambulatory Visit (HOSPITAL_COMMUNITY): Payer: Self-pay

## 2023-05-17 ENCOUNTER — Telehealth: Payer: Self-pay | Admitting: Pharmacy Technician

## 2023-05-17 NOTE — Telephone Encounter (Signed)
Pharmacy Patient Advocate Encounter   Received notification from CoverMyMeds that prior authorization for Fluticasone-Salmeterol 100-50MCG/ACT aerosol powder is required/requested.   Insurance verification completed.   The patient is insured through [employer] .   Per test claim: The current 30 day co-pay is, $20.00.  No PA needed at this time. This test claim was processed through [facility]- copay amounts may vary at other pharmacies due to pharmacy/plan contracts, or as the patient moves through the different stages of their insurance plan.

## 2023-05-18 LAB — CYTOLOGY - PAP
Comment: NEGATIVE
Diagnosis: NEGATIVE
High risk HPV: NEGATIVE

## 2023-06-13 ENCOUNTER — Ambulatory Visit (INDEPENDENT_AMBULATORY_CARE_PROVIDER_SITE_OTHER): Payer: Managed Care, Other (non HMO) | Admitting: Radiology

## 2023-06-13 ENCOUNTER — Ambulatory Visit
Admission: RE | Admit: 2023-06-13 | Discharge: 2023-06-13 | Disposition: A | Discharge status: Home / Self Care | Payer: Managed Care, Other (non HMO) | Source: Ambulatory Visit | Attending: Internal Medicine | Admitting: Internal Medicine

## 2023-06-13 VITALS — BP 106/65 | HR 111 | Temp 98.5°F | Resp 22

## 2023-06-13 DIAGNOSIS — J4541 Moderate persistent asthma with (acute) exacerbation: Secondary | ICD-10-CM

## 2023-06-13 DIAGNOSIS — R0602 Shortness of breath: Secondary | ICD-10-CM | POA: Diagnosis not present

## 2023-06-13 DIAGNOSIS — R051 Acute cough: Secondary | ICD-10-CM

## 2023-06-13 DIAGNOSIS — F17210 Nicotine dependence, cigarettes, uncomplicated: Secondary | ICD-10-CM

## 2023-06-13 LAB — POC COVID19/FLU A&B COMBO
Covid Antigen, POC: NEGATIVE
Influenza A Antigen, POC: NEGATIVE
Influenza B Antigen, POC: NEGATIVE

## 2023-06-13 MED ORDER — PROMETHAZINE-DM 6.25-15 MG/5ML PO SYRP: 5.0000 mL | ORAL_SOLUTION | Freq: Every evening | ORAL | 0 refills | Status: DC | PRN

## 2023-06-13 MED ORDER — ALBUTEROL SULFATE HFA 108 (90 BASE) MCG/ACT IN AERS
2.0000 | INHALATION_SPRAY | Freq: Once | RESPIRATORY_TRACT | Status: AC
  Administered 2023-06-13: 2 via RESPIRATORY_TRACT

## 2023-06-13 MED ORDER — ALBUTEROL SULFATE (2.5 MG/3ML) 0.083% IN NEBU: 2.5000 mg | INHALATION_SOLUTION | Freq: Four times a day (QID) | RESPIRATORY_TRACT | 12 refills | Status: AC | PRN

## 2023-06-13 MED ORDER — IPRATROPIUM-ALBUTEROL 0.5-2.5 (3) MG/3ML IN SOLN
3.0000 mL | Freq: Once | RESPIRATORY_TRACT | Status: AC
  Administered 2023-06-13: 3 mL via RESPIRATORY_TRACT

## 2023-06-13 MED ORDER — PREDNISONE 20 MG PO TABS: 40.0000 mg | ORAL_TABLET | Freq: Every day | ORAL | 0 refills | Status: AC

## 2023-06-13 MED ORDER — ALBUTEROL SULFATE (2.5 MG/3ML) 0.083% IN NEBU
2.5000 mg | INHALATION_SOLUTION | Freq: Once | RESPIRATORY_TRACT | Status: AC
  Administered 2023-06-13: 2.5 mg via RESPIRATORY_TRACT

## 2023-06-13 MED ORDER — DEXAMETHASONE SODIUM PHOSPHATE 10 MG/ML IJ SOLN
10.0000 mg | Freq: Once | INTRAMUSCULAR | Status: AC
  Administered 2023-06-13: 10 mg via INTRAMUSCULAR

## 2023-06-13 NOTE — ED Triage Notes (Signed)
Pt cough, sob, congestion, back pain since Sunday.

## 2023-06-13 NOTE — Discharge Instructions (Addendum)
Your symptoms are due to asthma attack. - Use albuterol every 4-6 hours as needed for cough, shortness of breath, and wheeze. - You may also use your nebulizer machine every 4-6 hours as needed for shortness of breath and wheezing.  - Take the steroid sent to the pharmacy as directed to help reduce lung inflammation and decrease the risk of another attack in the next few days. No NSAIDs like ibuprofen or aleve/naproxen while taking steroid. Take with food to avoid stomach upset. - Take prescribed cough medicine as directed.  If your symptoms do not improve in the next 2-3 days with interventions, please return. Please seek medical care for new or returning symptoms, such as difficulty breathing that doesn't improve with your medications, chest pain, voice changes, high fevers, confusion, or other new or worsening symptoms. Follow-up with PCP for ongoing management of asthma.

## 2023-06-13 NOTE — ED Provider Notes (Addendum)
Bonnie Mathews    CSN: [id number] Arrival date & time: 06/13/23  1317      History   Chief Complaint Chief Complaint  Patient presents with   Cough    HPI Bonnie Mathews is a 38 y.o. female.   Bonnie Mathews is a 38 y.o. female presenting for chief complaint of cough, nasal congestion, and generalized fatigue that started 3 days ago on Sunday June 10, 2023. Reports shortness of breath and bilateral chest tightness associated with coughing. She has asthma and she smokes cigarettes/vapes nicotine containing substance. She was prescribed an inhaler last month but was unable to afford the medicine so she has been without an inhaler. She was treated for asthma exacerbation 1 month ago with prednisone, no other recent steroids or antibiotics reported. Denies N/V/D, abdominal pain, rash, dizziness, and fever/chills. She works with the public at [employer] and may have been exposed to viral illness at work but she is unsure. Taking OTC cough and cold medications without much relief. She has not been able to smoke or vape in the last 3 days due to shortness of breath.    Cough   Past Medical History:  Diagnosis Date   Chickenpox    Chronic cough 07/15/2019   Dilated cardiomyopathy (HCC)    GERD (gastroesophageal reflux disease)    Numbness and tingling of right lower extremity 07/15/2019   Numbness and tingling of right upper extremity 06/09/2015   Peptic ulcer    Postcoital bleeding 12/23/2018   Present since 2016.    Normal exam   TSH, A1c drawn   Pap done   Change to low dose OCPs     Shortness of breath 07/15/2019   Tobacco abuse     Patient Active Problem List   Diagnosis Date Noted   GAD (generalized anxiety disorder) 05/15/2023   Asthma 09/30/2019   Encounter for annual general medical examination with abnormal findings in adult 12/23/2018   Chronic foot pain 07/09/2017   Chronic right shoulder pain 07/09/2017   GERD (gastroesophageal reflux disease)  07/09/2017   ALLERGIC RHINITIS, SEASONAL 03/06/2008   Tobacco abuse 02/14/2008    Past Surgical History:  Procedure Laterality Date   FOOT SURGERY Right 2020   lipoma removed from arch   TONSILLECTOMY AND ADENOIDECTOMY  1993    OB History   No obstetric history on file.      Home Medications    Prior to Admission medications   Medication Sig Start Date End Date Taking? Authorizing Provider  albuterol (PROVENTIL) (2.5 MG/3ML) 0.083% nebulizer solution Take 3 mLs (2.5 mg total) by nebulization every 6 (six) hours as needed for wheezing or shortness of breath. 06/13/23  Yes Carlisle Beers, FNP  predniSONE (DELTASONE) 20 MG tablet Take 2 tablets (40 mg total) by mouth daily with breakfast for 5 days. 06/13/23 06/18/23 Yes Carlisle Beers, FNP  promethazine-dextromethorphan (PROMETHAZINE-DM) 6.25-15 MG/5ML syrup Take 5 mLs by mouth at bedtime as needed for cough. 06/13/23  Yes Carlisle Beers, FNP  albuterol (PROVENTIL HFA) 108 (90 Base) MCG/ACT inhaler Inhale 1-2 puffs into the lungs every 6 (six) hours as needed for wheezing or shortness of breath. Patient not taking: Reported on 06/13/2023 05/16/23   Doreene Nest, NP  escitalopram (LEXAPRO) 10 MG tablet Take 1 tablet (10 mg total) by mouth daily. for anxiety 05/15/23   Doreene Nest, NP  fluticasone-salmeterol (ADVAIR) 100-50 MCG/ACT AEPB Inhale 1 puff into the lungs 2 (two) times daily. 05/15/23  Doreene Nest, NP    Family History Family History  Problem Relation Age of Onset   Alcohol abuse Paternal Grandfather    Mental illness Paternal Grandfather    Arthritis Mother    Heart disease Mother    Diabetes Mother    Hypertension Father    Arthritis Maternal Aunt    Breast cancer Maternal Aunt    Alcohol abuse Paternal Uncle    Mental illness Maternal Grandmother    Mental illness Maternal Grandfather    Colon cancer Paternal Grandmother    Mental illness Paternal Grandmother     Social  History Social History   Tobacco Use   Smoking status: Former    Current packs/day: 0.50    Average packs/day: 0.5 packs/day for 17.0 years (8.5 ttl pk-yrs)    Types: Cigarettes   Smokeless tobacco: Never  Vaping Use   Vaping status: Every Day  Substance Use Topics   Alcohol use: Not Currently    Comment: Occ   Drug use: Yes    Types: Marijuana    Comment: Daily     Allergies   Doxycycline   Review of Systems Review of Systems  Respiratory:  Positive for cough.   Per HPI   Physical Exam Triage Vital Signs ED Triage Vitals  Encounter Vitals Group     BP 06/13/23 1321 106/65     Systolic BP Percentile --      Diastolic BP Percentile --      Pulse Rate 06/13/23 1321 (!) 111     Resp 06/13/23 1321 (!) 22     Temp 06/13/23 1321 98.5 F (36.9 C)     Temp Source 06/13/23 1321 Oral     SpO2 06/13/23 1321 96 %     Weight --      Height --      Head Circumference --      Peak Flow --      Pain Score 06/13/23 1325 10     Pain Loc --      Pain Education --      Exclude from Growth Chart --    No data found.  Updated Vital Signs BP 106/65 (BP Location: Right Arm)   Pulse (!) 111   Temp 98.5 F (36.9 C) (Oral)   Resp (!) 22   LMP 06/05/2023 (Exact Date)   SpO2 96%   Visual Acuity Right Eye Distance:   Left Eye Distance:   Bilateral Distance:    Right Eye Near:   Left Eye Near:    Bilateral Near:     Physical Exam Vitals and nursing note reviewed.  Constitutional:      Appearance: She is not ill-appearing or toxic-appearing.  HENT:     Head: Normocephalic and atraumatic.     Right Ear: Hearing, tympanic membrane, ear canal and external ear normal.     Left Ear: Hearing, tympanic membrane, ear canal and external ear normal.     Nose: Congestion present.     Mouth/Throat:     Lips: Pink.     Mouth: Mucous membranes are moist. No injury or oral lesions.     Dentition: Normal dentition.     Tongue: No lesions.     Pharynx: Oropharynx is clear. Uvula  midline. No pharyngeal swelling, oropharyngeal exudate, posterior oropharyngeal erythema, uvula swelling or postnasal drip.     Tonsils: No tonsillar exudate.     Comments: Mild erythema to posterior oropharynx with small amount of clear postnasal drainage visualized.  Eyes:     General: Lids are normal. Vision grossly intact. Gaze aligned appropriately.     Extraocular Movements: Extraocular movements intact.     Conjunctiva/sclera: Conjunctivae normal.  Neck:     Trachea: Trachea and phonation normal.  Cardiovascular:     Rate and Rhythm: Normal rate and regular rhythm.     Heart sounds: Normal heart sounds, S1 normal and S2 normal.  Pulmonary:     Effort: Pulmonary effort is normal. No respiratory distress.     Breath sounds: Normal air entry. Wheezing (Inspiratory and expiratory wheezing heard to all lung fields bilaterally.) present. No rhonchi or rales.     Comments: Speaking in short sentences with increased respiratory effort on initial assessment.  Chest:     Chest wall: No tenderness.  Musculoskeletal:     Cervical back: Neck supple.     Right lower leg: No edema.     Left lower leg: No edema.  Lymphadenopathy:     Cervical: No cervical adenopathy.  Skin:    General: Skin is warm and dry.     Capillary Refill: Capillary refill takes less than 2 seconds.     Findings: No rash.  Neurological:     General: No focal deficit present.     Mental Status: She is alert and oriented to person, place, and time. Mental status is at baseline.     Cranial Nerves: No dysarthria or facial asymmetry.  Psychiatric:        Mood and Affect: Mood normal.        Speech: Speech normal.        Behavior: Behavior normal.        Thought Content: Thought content normal.        Judgment: Judgment normal.      Mathews Treatments / Results  Labs (all labs ordered are listed, but only abnormal results are displayed) Labs Reviewed  POC COVID19/FLU A&B COMBO - Normal    EKG   Radiology No  results found.  Procedures Procedures (including critical care time)  Medications Ordered in Mathews Medications  ipratropium-albuterol (DUONEB) 0.5-2.5 (3) MG/3ML nebulizer solution 3 mL (3 mLs Nebulization Given 06/13/23 1331)  albuterol (PROVENTIL) (2.5 MG/3ML) 0.083% nebulizer solution 2.5 mg (2.5 mg Nebulization Given 06/13/23 1332)  dexamethasone (DECADRON) injection 10 mg (10 mg Intramuscular Given 06/13/23 1344)  albuterol (VENTOLIN HFA) 108 (90 Base) MCG/ACT inhaler 2 puff (2 puffs Inhalation Given 06/13/23 1359)    Initial Impression / Assessment and Plan / Mathews Course  I have reviewed the triage vital signs and the nursing notes.  Pertinent labs & imaging results that were available during my care of the patient were reviewed by me and considered in my medical decision making (see chart for details).   1. Moderate persistent asthma with acute exacerbation, cigarette nicotine dependence without complication, shortness of breath, acute cough Evaluation suggests asthma exacerbation likely triggered by viral URI.   Will treat with steroid, bronchodilator, cough suppressants for symptomatic relief, and expectorants (mucinex) as needed.   Imaging: chest x-ray shows peribronchial thickening consistent with viral bronchitis/asthma exacerbation by my interpretation. No evidence of pneumothorax, pleural effusion, or focal consolidation/pneumonia. Staff will call if radiology re-read is abnormal.   Strep/viral testing: POC COVID and Influenza A+B testing negative.  Interventions in clinic: DuoNeb and albuterol breathing treatments given in clinic with significant improvement in reported shortness of breath and lung sounds on re-assessment prior to discharge. Speaking in full sentences without difficulty after breathing treatments.  Nebulizer for home use provided due to moderate/severe asthma.  Discussed PCP follow-up to discuss asthma action plan to prevent future asthma exacerbations.    Counseled patient on potential for adverse effects with medications prescribed/recommended today, strict ER and return-to-clinic precautions discussed, patient verbalized understanding.    Final Clinical Impressions(s) / Mathews Diagnoses   Final diagnoses:  Moderate persistent asthma with (acute) exacerbation  Cigarette nicotine dependence without complication  Shortness of breath  Acute cough     Discharge Instructions      Your symptoms are due to asthma attack. - Use albuterol every 4-6 hours as needed for cough, shortness of breath, and wheeze. - You may also use your nebulizer machine every 4-6 hours as needed for shortness of breath and wheezing.  - Take the steroid sent to the pharmacy as directed to help reduce lung inflammation and decrease the risk of another attack in the next few days. No NSAIDs like ibuprofen or aleve/naproxen while taking steroid. Take with food to avoid stomach upset. - Take prescribed cough medicine as directed.  If your symptoms do not improve in the next 2-3 days with interventions, please return. Please seek medical care for new or returning symptoms, such as difficulty breathing that doesn't improve with your medications, chest pain, voice changes, high fevers, confusion, or other new or worsening symptoms. Follow-up with PCP for ongoing management of asthma.      ED Prescriptions     Medication Sig Dispense Auth. Provider   albuterol (PROVENTIL) (2.5 MG/3ML) 0.083% nebulizer solution Take 3 mLs (2.5 mg total) by nebulization every 6 (six) hours as needed for wheezing or shortness of breath. 75 mL Reita May M, FNP   predniSONE (DELTASONE) 20 MG tablet Take 2 tablets (40 mg total) by mouth daily with breakfast for 5 days. 10 tablet Carlisle Beers, FNP   promethazine-dextromethorphan (PROMETHAZINE-DM) 6.25-15 MG/5ML syrup Take 5 mLs by mouth at bedtime as needed for cough. 118 mL Carlisle Beers, FNP      PDMP not reviewed  this encounter.   Carlisle Beers, FNP 06/13/23 1416    Carlisle Beers, FNP 06/13/23 [phone]

## 2023-06-26 ENCOUNTER — Ambulatory Visit: Payer: Managed Care, Other (non HMO) | Admitting: Primary Care

## 2023-07-10 ENCOUNTER — Ambulatory Visit: Payer: Managed Care, Other (non HMO) | Admitting: Primary Care

## 2023-07-17 ENCOUNTER — Ambulatory Visit (INDEPENDENT_AMBULATORY_CARE_PROVIDER_SITE_OTHER): Payer: Managed Care, Other (non HMO) | Admitting: Primary Care

## 2023-07-17 ENCOUNTER — Encounter: Payer: Self-pay | Admitting: Primary Care

## 2023-07-17 VITALS — BP 122/76 | HR 75 | Temp 98.4°F | Ht 63.0 in | Wt 200.0 lb

## 2023-07-17 DIAGNOSIS — F411 Generalized anxiety disorder: Secondary | ICD-10-CM

## 2023-07-17 MED ORDER — SERTRALINE HCL 50 MG PO TABS: 50.0000 mg | ORAL_TABLET | Freq: Every day | ORAL | 0 refills | Status: DC

## 2023-07-17 NOTE — Patient Instructions (Signed)
 Stop taking Lexapro for anxiety.  Start taking sertraline (Zoloft) 50 mg daily.  It was a pleasure to see you today!

## 2023-07-17 NOTE — Assessment & Plan Note (Signed)
 Improved but she is experiencing ongoing side effects.  Stop Lexapro 10 mg. Start Zoloft 50 mg daily.   She will update in 4 weeks via MyChart.

## 2023-07-17 NOTE — Progress Notes (Signed)
 Subjective:    Patient ID: Bonnie Mathews, female    DOB: [birth year], 38 y.o.   MRN: [id number]  HPI  Bonnie Mathews is a very pleasant 38 y.o. female with a history of GAD, asthma, GERD who presents today for follow-up of anxiety.  She was last evaluated on 05/15/2023 for her annual physical.  During this visit she mentioned chronic uncontrolled anxiety with symptoms of worrying, racing thoughts, overthinking things, little interest in doing things.  Given her symptoms she was initiated on Lexapro 10 mg daily.  She is here for follow-up today.  Since her last visit she's noticed improvement including less worrying, is able to sleep at night, less mind racing thoughts, less over thinking things. Her heart palpitations have resolved. She has noticed nausea, headaches, and some dizziness since she started Lexapro, occurs in the morning when waking. She takes her Lexapro at night. She has noticed increased irritability, isn't sure if this is hormonal.   She denies SI/HI  Review of Systems  Gastrointestinal:  Positive for nausea.  Neurological:  Positive for dizziness and headaches.  Psychiatric/Behavioral:  Negative for suicidal ideas. The patient is nervous/anxious.        See HPI         Past Medical History:  Diagnosis Date   Chickenpox    Chronic cough 07/15/2019   Dilated cardiomyopathy (HCC)    GERD (gastroesophageal reflux disease)    Numbness and tingling of right lower extremity 07/15/2019   Numbness and tingling of right upper extremity 06/09/2015   Peptic ulcer    Postcoital bleeding 12/23/2018   Present since 2016.    Normal exam   TSH, A1c drawn   Pap done   Change to low dose OCPs     Shortness of breath 07/15/2019   Tobacco abuse     Social History   Socioeconomic History   Marital status: Single    Spouse name: Not on file   Number of children: Not on file   Years of education: Not on file   Highest education level: GED or equivalent  Occupational  History   Not on file  Tobacco Use   Smoking status: Former    Current packs/day: 0.50    Average packs/day: 0.5 packs/day for 17.0 years (8.5 ttl pk-yrs)    Types: Cigarettes   Smokeless tobacco: Never  Vaping Use   Vaping status: Every Day  Substance and Sexual Activity   Alcohol use: Not Currently    Comment: Occ   Drug use: Yes    Types: Marijuana    Comment: Daily   Sexual activity: Not on file  Other Topics Concern   Not on file  Social History Narrative   Single.   1 child.   Works as a Production designer, theatre/television/film at [employer]   Enjoys working out and watching TV.   She's working on her GED.    Social Drivers of Health   Financial Resource Strain: Medium Risk (05/11/2023)   Overall Financial Resource Strain (CARDIA)    Difficulty of Paying Living Expenses: Somewhat hard  Food Insecurity: Food Insecurity Present (05/11/2023)   Hunger Vital Sign    Worried About Running Out of Food in the Last Year: Sometimes true    Ran Out of Food in the Last Year: Sometimes true  Transportation Needs: No Transportation Needs (05/11/2023)   PRAPARE - Administrator, Civil Service (Medical): No    Lack of Transportation (Non-Medical): No  Physical Activity:  Sufficiently Active (05/11/2023)   Exercise Vital Sign    Days of Exercise per Week: 3 days    Minutes of Exercise per Session: 60 min  Stress: Stress Concern Present (05/11/2023)   [employer] of Occupational Health - Occupational Stress Questionnaire    Feeling of Stress : Very much  Social Connections: Socially Isolated (05/11/2023)   Social Connection and Isolation Panel [NHANES]    Frequency of Communication with Friends and Family: More than three times a week    Frequency of Social Gatherings with Friends and Family: Twice a week    Attends Religious Services: Never    Database administrator or Organizations: No    Attends Engineer, structural: Not on file    Marital Status: Divorced  Catering manager Violence:  Not on file    Past Surgical History:  Procedure Laterality Date   FOOT SURGERY Right 2020   lipoma removed from arch   TONSILLECTOMY AND ADENOIDECTOMY  1993    Family History  Problem Relation Age of Onset   Alcohol abuse Paternal Grandfather    Mental illness Paternal Grandfather    Arthritis Mother    Heart disease Mother    Diabetes Mother    Hypertension Father    Arthritis Maternal Aunt    Breast cancer Maternal Aunt    Alcohol abuse Paternal Uncle    Mental illness Maternal Grandmother    Mental illness Maternal Grandfather    Colon cancer Paternal Grandmother    Mental illness Paternal Grandmother     Allergies  Allergen Reactions   Doxycycline Shortness Of Breath    Current Outpatient Medications on File Prior to Visit  Medication Sig Dispense Refill   albuterol (PROVENTIL HFA) 108 (90 Base) MCG/ACT inhaler Inhale 1-2 puffs into the lungs every 6 (six) hours as needed for wheezing or shortness of breath. (Patient not taking: Reported on 07/17/2023) 6.7 g 0   albuterol (PROVENTIL) (2.5 MG/3ML) 0.083% nebulizer solution Take 3 mLs (2.5 mg total) by nebulization every 6 (six) hours as needed for wheezing or shortness of breath. (Patient not taking: Reported on 07/17/2023) 75 mL 12   fluticasone-salmeterol (ADVAIR) 100-50 MCG/ACT AEPB Inhale 1 puff into the lungs 2 (two) times daily. (Patient not taking: Reported on 07/17/2023) 60 each 11   No current facility-administered medications on file prior to visit.    BP 122/76   Pulse 75   Temp 98.4 F (36.9 C) (Temporal)   Ht 5\' 3"  (1.6 m)   Wt 200 lb (90.7 kg)   SpO2 99%   BMI 35.43 kg/m  Objective:   Physical Exam Cardiovascular:     Rate and Rhythm: Normal rate and regular rhythm.  Pulmonary:     Effort: Pulmonary effort is normal.     Breath sounds: Normal breath sounds.  Musculoskeletal:     Cervical back: Neck supple.  Skin:    General: Skin is warm and dry.  Neurological:     Mental Status: She is  alert and oriented to person, place, and time.  Psychiatric:        Mood and Affect: Mood normal.           Assessment & Plan:  GAD (generalized anxiety disorder) Assessment & Plan: Improved but she is experiencing ongoing side effects.  Stop Lexapro 10 mg. Start Zoloft 50 mg daily.   She will update in 4 weeks via MyChart.   Orders: -     Sertraline HCl; Take 1 tablet (50  mg total) by mouth daily. for anxiety  Dispense: 90 tablet; Refill: 0        Doreene Nest, NP

## 2023-10-26 ENCOUNTER — Other Ambulatory Visit: Payer: Self-pay | Admitting: Primary Care

## 2023-10-26 DIAGNOSIS — F411 Generalized anxiety disorder: Secondary | ICD-10-CM

## 2023-10-27 ENCOUNTER — Other Ambulatory Visit: Payer: Self-pay | Admitting: Primary Care

## 2023-10-27 DIAGNOSIS — J4540 Moderate persistent asthma, uncomplicated: Secondary | ICD-10-CM

## 2023-11-12 NOTE — Telephone Encounter (Unsigned)
 Copied from CRM [id number]. Topic: Clinical - Medication Refill >> Nov 12, 2023  9:42 AM Tiffany S wrote: Medication: albuterol  (VENTOLIN  HFA) 108 (90 Base) MCG/ACT inhaler [[id number]] albuterol  (PROVENTIL ) (2.5 MG/3ML) 0.083% nebulizer solution [[id number]] fluticasone -salmeterol (ADVAIR) 100-50 MCG/ACT AEPB [[id number]] sertraline  (ZOLOFT ) 50 MG tablet [[id number]]  Has the patient contacted their pharmacy? Yes (Agent: If no, request that the patient contact the pharmacy for the refill. If patient does not wish to contact the pharmacy document the reason why and proceed with request.) (Agent: If yes, when and what did the pharmacy advise?)  This [facility] the [facility]  [phone] or  [phone]  Is this the correct pharmacy for this prescription? Yes If no, delete pharmacy and type the correct one.   Has the prescription been filled recently? Yes  Is the patient out of the medication? Yes  Has the patient been seen for an appointment in the last year OR does the patient have an upcoming appointment? Yes  Can we respond through MyChart? Yes  Agent: Please be advised that Rx refills may take up to 3 business days. We ask that you follow-up with your pharmacy.

## 2023-11-20 ENCOUNTER — Other Ambulatory Visit: Payer: Self-pay | Admitting: Primary Care

## 2023-11-20 DIAGNOSIS — J4540 Moderate persistent asthma, uncomplicated: Secondary | ICD-10-CM

## 2023-11-20 DIAGNOSIS — F411 Generalized anxiety disorder: Secondary | ICD-10-CM

## 2023-11-20 MED ORDER — FLUTICASONE-SALMETEROL 100-50 MCG/ACT IN AEPB: 1.0000 | INHALATION_SPRAY | Freq: Two times a day (BID) | RESPIRATORY_TRACT | 5 refills | Status: DC

## 2023-11-20 MED ORDER — SERTRALINE HCL 50 MG PO TABS: 50.0000 mg | ORAL_TABLET | Freq: Every day | ORAL | 1 refills | Status: DC

## 2023-11-20 NOTE — Telephone Encounter (Signed)
 Copied from CRM [id number]. Topic: Clinical - Medication Refill >> Nov 20, 2023 10:07 AM Kevelyn M wrote: Medication: sertraline  (ZOLOFT ) 50 MG tablet, fluticasone -salmeterol (ADVAIR) 100-50 MCG/ACT AEPB 90 day supply  Has the patient contacted their pharmacy? Yes (Agent: If no, request that the patient contact the pharmacy for the refill. If patient does not wish to contact the pharmacy document the reason why and proceed with request.) (Agent: If yes, when and what did the pharmacy advise?)  This is the patient's preferred pharmacy:  [facility] [address] [place], [place]  [address] [phone]  Is this the correct pharmacy for this prescription? Yes If no, delete pharmacy and type the correct one.   Has the prescription been filled recently? Yes  Is the patient out of the medication? No  Has the patient been seen for an appointment in the last year OR does the patient have an upcoming appointment? Yes  Can we respond through MyChart? No  Agent: Please be advised that Rx refills may take up to 3 business days. We ask that you follow-up with your pharmacy.

## 2023-11-20 NOTE — Telephone Encounter (Signed)
 Refills sent to pharmacy.

## 2023-12-11 ENCOUNTER — Ambulatory Visit: Payer: Self-pay

## 2023-12-11 NOTE — Telephone Encounter (Signed)
 FYI Only or Action Required?: Action required by provider: Sent to UC, no available appts in clinic.  Patient was last seen in primary care on 07/17/2023 by Gretta Comer POUR, NP.  Called Nurse Triage reporting Dizziness.  Symptoms began today.  Interventions attempted: Other: slow movements.  Symptoms are: gradually worsening.  Triage Disposition: See Physician Within 24 Hours  Patient/caregiver understands and will follow disposition?: Yes     Copied from CRM #[id number]. Topic: Clinical - Red Word Triage >> Dec 11, 2023  4:37 PM [place] J wrote: [facility] that prompted transfer to Nurse Triage: Patient has been feeling very dizzy since yesterday. She feels as though the room is spinning and she is also light headed. Reason for Disposition  [1] MODERATE dizziness (e.g., interferes with normal activities) AND [2] has NOT been evaluated by doctor (or NP/PA) for this  (Exception: Dizziness caused by heat exposure, sudden standing, or poor fluid intake.)  Answer Assessment - Initial Assessment Questions Advised patient to go to Urgent Care, no available appointments.  Patient reports dizziness, frequent urination, feels like have tight headband on, only felt that way with migraines; no dx of migraines/meds  Patient denies feeling faint, weakness, HA, blurred vision, n/v/d  Took BP-129/77, 93 HR today; no hx cardiac/htn  1. DESCRIPTION: Describe your dizziness.     Dizziness and lightheadness, when bending down 2. LIGHTHEADED: Do you feel lightheaded? (e.g., somewhat faint, woozy, weak upon standing)     If walking, movement of head too quick or bending down 3. VERTIGO: Do you feel like either you or the room is spinning or tilting? (i.e., vertigo)     Everything is spinning, I have to stand there for a second with eyes close, but doesn't help 4. SEVERITY: How bad is it?  Do you feel like you are going to faint? Can you stand and walk?     Have to get up slowly and stop  walking due to dizziness. Denies feeling faint 5. ONSET:  When did the dizziness begin?     Around 2AM this morning, all symptoms today 6. AGGRAVATING FACTORS: Does anything make it worse? (e.g., standing, change in head position)     movement 7. HEART RATE: Can you tell me your heart rate? How many beats in 15 seconds?  (Note: Not all patients can do this.)       93 8. CAUSE: What do you think is causing the dizziness? (e.g., decreased fluids or food, diarrhea, emotional distress, heat exposure, new medicine, sudden standing, vomiting; unknown)     Drinking plently fluids, denies v/d, new medicine Took Zoloft  and Zyrtec, haven't taken yet 9. RECURRENT SYMPTOM: Have you had dizziness before? If Yes, ask: When was the last time? What happened that time?     no 10. OTHER SYMPTOMS: Do you have any other symptoms? (e.g., fever, chest pain, vomiting, diarrhea, bleeding)       Frequent urination; denies discomfort  11. PREGNANCY: Is there any chance you are pregnant? When was your last menstrual period?      11/20/23  Protocols used: Dizziness - Lightheadedness-A-AH

## 2023-12-12 ENCOUNTER — Telehealth: Payer: Self-pay

## 2023-12-12 ENCOUNTER — Ambulatory Visit
Admission: RE | Admit: 2023-12-12 | Discharge: 2023-12-12 | Disposition: A | Discharge status: Home / Self Care | Payer: Self-pay | Source: Ambulatory Visit | Attending: Family Medicine | Admitting: Family Medicine

## 2023-12-12 VITALS — BP 120/79 | HR 79 | Temp 99.0°F | Resp 16

## 2023-12-12 DIAGNOSIS — H6993 Unspecified Eustachian tube disorder, bilateral: Secondary | ICD-10-CM | POA: Diagnosis not present

## 2023-12-12 DIAGNOSIS — H8110 Benign paroxysmal vertigo, unspecified ear: Secondary | ICD-10-CM

## 2023-12-12 DIAGNOSIS — J309 Allergic rhinitis, unspecified: Secondary | ICD-10-CM | POA: Diagnosis not present

## 2023-12-12 MED ORDER — PREDNISONE 20 MG PO TABS: ORAL_TABLET | ORAL | 0 refills | Status: DC

## 2023-12-12 MED ORDER — MECLIZINE HCL 12.5 MG PO TABS
12.5000 mg | ORAL_TABLET | Freq: Three times a day (TID) | ORAL | 0 refills | Status: DC | PRN
Start: 2023-12-12 — End: 2023-12-28

## 2023-12-12 NOTE — Telephone Encounter (Signed)
 Attempted to reach out to patient regarding 4pm appt for Dizziness. Unable to reach.  WENDI Dixon CMA

## 2023-12-12 NOTE — ED Provider Notes (Signed)
 [facility]  Note:  This document was prepared using Conservation officer, historic buildings and may include unintentional dictation errors.  MRN: [id number] DOB: 1986/02/21  Subjective:   Bonnie Mathews is a 39 y.o. female presenting for acute onset overnight of dizziness, vertigo, a slight headache with some nausea.  The vertigo is exacerbated with head movements or changes in position.  Lasts a few seconds and resolves but easily returns.  No confusion, weakness, numbness or tingling.  No history of stroke, TIA, cerebrovascular disease, neurologic conditions.  Patient does have longstanding history of asthma and allergic rhinitis.  Has not taken her allergy medication today.  She does have a history of a peptic ulcer and therefore her PCP has recommended to limit her use of steroids.  Patient does vape.  No current facility-administered medications for this encounter.  Current Outpatient Medications:    albuterol  (PROVENTIL ) (2.5 MG/3ML) 0.083% nebulizer solution, Take 3 mLs (2.5 mg total) by nebulization every 6 (six) hours as needed for wheezing or shortness of breath. (Patient not taking: Reported on 07/17/2023), Disp: 75 mL, Rfl: 12   albuterol  (VENTOLIN  HFA) 108 (90 Base) MCG/ACT inhaler, INHALE 1 TO 2 PUFFS BY MOUTH EVERY 6 HOURS AS NEEDED FOR WHEEZING FOR SHORTNESS OF BREATH, Disp: 7 g, Rfl: 0   fluticasone -salmeterol (ADVAIR) 100-50 MCG/ACT AEPB, Inhale 1 puff into the lungs 2 (two) times daily., Disp: 60 each, Rfl: 5   sertraline  (ZOLOFT ) 50 MG tablet, Take 1 tablet (50 mg total) by mouth daily. For anxiety, Disp: 90 tablet, Rfl: 1   Allergies  Allergen Reactions   Doxycycline Shortness Of Breath    Past Medical History:  Diagnosis Date   Chickenpox    Chronic cough 07/15/2019   Dilated cardiomyopathy (HCC)    GERD (gastroesophageal reflux disease)    Numbness and tingling of right lower extremity 07/15/2019   Numbness and tingling of right upper  extremity 06/09/2015   Peptic ulcer    Postcoital bleeding 12/23/2018   Present since 2016.    Normal exam   TSH, A1c drawn   Pap done   Change to low dose OCPs     Shortness of breath 07/15/2019   Tobacco abuse      Past Surgical History:  Procedure Laterality Date   FOOT SURGERY Right 2020   lipoma removed from arch   TONSILLECTOMY AND ADENOIDECTOMY  1993    Family History  Problem Relation Age of Onset   Alcohol abuse Paternal Grandfather    Mental illness Paternal Grandfather    Arthritis Mother    Heart disease Mother    Diabetes Mother    Hypertension Father    Arthritis Maternal Aunt    Breast cancer Maternal Aunt    Alcohol abuse Paternal Uncle    Mental illness Maternal Grandmother    Mental illness Maternal Grandfather    Colon cancer Paternal Grandmother    Mental illness Paternal Grandmother     Social History   Tobacco Use   Smoking status: Former    Current packs/day: 0.50    Average packs/day: 0.5 packs/day for 17.0 years (8.5 ttl pk-yrs)    Types: Cigarettes   Smokeless tobacco: Never  Vaping Use   Vaping status: Every Day  Substance Use Topics   Alcohol use: Not Currently    Comment: Occ   Drug use: Yes    Types: Marijuana    Comment: Daily    ROS   Objective:   Vitals: BP  120/79 (BP Location: Left Arm)   Pulse 79   Temp 99 F (37.2 C) (Oral)   Resp 16   LMP 11/20/2023 (Exact Date)   SpO2 97%   Physical Exam Constitutional:      General: She is not in acute distress.    Appearance: Normal appearance. She is well-developed and normal weight. She is not ill-appearing, toxic-appearing or diaphoretic.  HENT:     Head: Normocephalic and atraumatic.     Right Ear: Ear canal and external ear normal. No drainage or tenderness. No middle ear effusion. There is no impacted cerumen. Tympanic membrane is not erythematous or bulging.     Left Ear: Ear canal and external ear normal. No drainage or tenderness.  No middle ear effusion. There  is no impacted cerumen. Tympanic membrane is not erythematous or bulging.     Ears:     Comments: Significant bilateral middle ear effusions.    Nose: No congestion or rhinorrhea.     Mouth/Throat:     Mouth: Mucous membranes are moist. No oral lesions.     Pharynx: No pharyngeal swelling, oropharyngeal exudate, posterior oropharyngeal erythema or uvula swelling.     Tonsils: No tonsillar exudate or tonsillar abscesses.  Eyes:     General: No scleral icterus.       Right eye: No discharge.        Left eye: No discharge.     Extraocular Movements: Extraocular movements intact.     Right eye: Normal extraocular motion.     Left eye: Normal extraocular motion.     Conjunctiva/sclera: Conjunctivae normal.  Cardiovascular:     Rate and Rhythm: Normal rate.  Pulmonary:     Effort: Pulmonary effort is normal.  Musculoskeletal:     Cervical back: Normal range of motion and neck supple.  Lymphadenopathy:     Cervical: No cervical adenopathy.  Skin:    General: Skin is warm and dry.  Neurological:     General: No focal deficit present.     Mental Status: She is alert and oriented to person, place, and time.     Cranial Nerves: No cranial nerve deficit.     Motor: No weakness.     Coordination: Coordination normal.     Gait: Gait normal.     Comments: No atrial asymmetry.  Negative Romberg and pronator drift.  Psychiatric:        Mood and Affect: Mood normal.        Behavior: Behavior normal.     Assessment and Plan :   PDMP not reviewed this encounter.  1. Benign paroxysmal positional vertigo, unspecified laterality   2. Eustachian tube dysfunction, bilateral   3. Allergic rhinitis, unspecified seasonality, unspecified trigger    Patient has HPI consistent with BPPV.  I suspect this is likely secondary to allergic rhinitis, chronic sinus disease, eustachian tube dysfunction.  Recommended oral prednisone  course.  Maintain all other treatments.  Low suspicion for an acute  encephalopathy.  Counseled patient on potential for adverse effects with medications prescribed/recommended today, ER and return-to-clinic precautions discussed, patient verbalized understanding.    Christopher Savannah, [place] 12/12/23 1739

## 2023-12-12 NOTE — ED Triage Notes (Signed)
 Pt reports she started feeling dizzy around 0300 am today, headache and nausea.  Dizziness is worse when moving. Pt has not taken any meds fro complaint.

## 2023-12-13 NOTE — Telephone Encounter (Signed)
Noted, will await UC notes 

## 2023-12-25 ENCOUNTER — Ambulatory Visit: Payer: Self-pay

## 2023-12-25 NOTE — Telephone Encounter (Signed)
 FYI Only or Action Required?: Action required by provider: request for appointment.  Patient was last seen in primary care on 07/17/2023 by Gretta Comer POUR, NP.  Called Nurse Triage reporting Otalgia.  Symptoms began several weeks ago.  Interventions attempted: Prescription medications: steriod.  Symptoms are: unchanged.  Triage Disposition: See Physician Within 24 Hours  Patient/caregiver understands and will follow disposition?: YesCopied from CRM #[id number]. Topic: Clinical - Red Word Triage >> Dec 25, 2023 11:01 AM Jayma L wrote: Red Word that prompted transfer to Nurse Triage: patients mom called in and stated patient was seen at urgent care on 12/14/2023 for ear pain , seen being seen she's having the same ear pain and is now getting dizzy and light headed and sometimes can barley walk she gets so dizzy. The pain is in both ears , asking for a Friday appt please Reason for Disposition  Earache  (Exceptions: Brief ear pain of lasting less than 60 minutes, or earache occurring during air travel.)  Answer Assessment - Initial Assessment Questions Pt was seen on 8/20 for fluid on ears/dizziness. Steroid helped but pain is returning.  Mom Darice called to set up appt for Friday due to pt's work schedule.      1. LOCATION: Which ear is involved?     Both ears 2. ONSET: When did the ear pain start?      Several weeks 3. SEVERITY: How bad is the pain?  (Scale 1-10; mild, moderate or severe)     4 4. URI SYMPTOMS: Do you have a runny nose or cough?     yes 5. FEVER: Do you have a fever? If Yes, ask: What is your temperature, how was it measured, and when did it start?     no 6. CAUSE: Have you been swimming recently?, How often do you use Q-TIPS?, Have you had any recent air travel or scuba diving?     na 7. OTHER SYMPTOMS: Do you have any other symptoms? (e.g., decreased hearing, dizziness, headache, stiff neck, vomiting)     Decreased hearing,  dizziness  Protocols used: Earache-A-AH

## 2023-12-25 NOTE — Telephone Encounter (Signed)
 Appointment scheduled with Dr. Avelina on 12/28/23 at 10:20 am.

## 2023-12-25 NOTE — Telephone Encounter (Signed)
Noted, will see

## 2023-12-28 ENCOUNTER — Encounter: Payer: Self-pay | Admitting: Family Medicine

## 2023-12-28 ENCOUNTER — Ambulatory Visit (INDEPENDENT_AMBULATORY_CARE_PROVIDER_SITE_OTHER): Admitting: Family Medicine

## 2023-12-28 VITALS — BP 110/60 | HR 84 | Temp 98.6°F | Ht 63.0 in | Wt 206.5 lb

## 2023-12-28 DIAGNOSIS — J4540 Moderate persistent asthma, uncomplicated: Secondary | ICD-10-CM

## 2023-12-28 DIAGNOSIS — J019 Acute sinusitis, unspecified: Secondary | ICD-10-CM | POA: Insufficient documentation

## 2023-12-28 DIAGNOSIS — H6993 Unspecified Eustachian tube disorder, bilateral: Secondary | ICD-10-CM | POA: Insufficient documentation

## 2023-12-28 HISTORY — DX: Unspecified eustachian tube disorder, bilateral: H69.93

## 2023-12-28 HISTORY — DX: Acute sinusitis, unspecified: J01.90

## 2023-12-28 MED ORDER — FLUCONAZOLE 150 MG PO TABS: ORAL_TABLET | ORAL | 0 refills | Status: DC

## 2023-12-28 MED ORDER — AMOXICILLIN 500 MG PO CAPS: 1000.0000 mg | ORAL_CAPSULE | Freq: Two times a day (BID) | ORAL | 0 refills | Status: DC

## 2023-12-28 MED ORDER — PREDNISONE 20 MG PO TABS
ORAL_TABLET | ORAL | 0 refills | Status: DC
Start: 2023-12-28 — End: 2024-01-17

## 2023-12-28 MED ORDER — ALBUTEROL SULFATE HFA 108 (90 BASE) MCG/ACT IN AERS: 1.0000 | INHALATION_SPRAY | Freq: Four times a day (QID) | RESPIRATORY_TRACT | 0 refills | Status: AC | PRN

## 2023-12-28 NOTE — Assessment & Plan Note (Signed)
 Acute, ongoing greater than 2 weeks, concern for bacterial superinfection.  Will treat with amoxicillin  500 mg 2 tablets twice daily x 10 days. Patient always gets yeast infection following amoxicillin  so I's also sent a prescription for fluconazole  150 mg p.o. x 1, may repeat in 2 days if not improving as expected.  Return and ER precautions provided.

## 2023-12-28 NOTE — Assessment & Plan Note (Signed)
 Chronic, no current asthma exacerbation.  Albuterol  refilled as requested.

## 2023-12-28 NOTE — Assessment & Plan Note (Signed)
 Acute, some improvement especially with vertigo with prednisone  course. Patient with continued fluid behind bilateral eardrums, possible associated sinus infection.  Will treat with longer prednisone  taper 40 mg x 5 days, then 20 mg x 5 days. She will add nasal saline irrigation and continue allergy medication Zyrtec at bedtime.

## 2023-12-28 NOTE — Progress Notes (Signed)
 Patient ID: Bonnie Mathews, female    DOB: 08-15-1985, 38 y.o.   MRN: [id number]  This visit was conducted in person.   BP 110/60   Pulse 84   Temp 98.6 F (37 C) (Temporal)   Ht 5' 3 (1.6 m)   Wt 206 lb 8 oz (93.7 kg)   LMP 12/17/2023 (Exact Date)   SpO2 95%   BMI 36.58 kg/m    CC:  Chief Complaint  Patient presents with   Ear Pain    Bilateral-Urgent Care 12/12/23    Subjective:   HPI: Bonnie Mathews is a 38 y.o. female presenting on 12/28/2023 for Ear Pain [facility] 12/12/23)   Patient of Mallie Gaskins with history of moderate persistent asthma.  Patient was seen on December 12, 2023 at an urgent care for dizziness, vertigo, and slight headache. Treated with prednisone  20 mg 2 tablets daily for 5 days, meclizine  as needed At that office visit recorded significant bilateral middle ear effusions, no sign of ear infection.  Diagnosed with BPPV, bilateral eustachian tube dysfunction and allergic rhinitis.  Today she reports now in last  1 week having twinges of pain in bilateral ears. Ear canal ar itchy.  Pressure over sinuses, mild headache.  No fever.    Increase nasal discharge.. white to clear.  Left upper jaw hurting at times.   No wheeze or SOB.  Using adviar daily. Need refill of  albuterol .  Relevant past medical, surgical, family and social history reviewed and updated as indicated. Interim medical history since our last visit reviewed. Allergies and medications reviewed and updated. Outpatient Medications Prior to Visit  Medication Sig Dispense Refill   albuterol  (PROVENTIL ) (2.5 MG/3ML) 0.083% nebulizer solution Take 3 mLs (2.5 mg total) by nebulization every 6 (six) hours as needed for wheezing or shortness of breath. 75 mL 12   fluticasone -salmeterol (ADVAIR) 100-50 MCG/ACT AEPB Inhale 1 puff into the lungs 2 (two) times daily. 60 each 5   sertraline  (ZOLOFT ) 50 MG tablet Take 1 tablet (50 mg total) by mouth daily. For anxiety 90 tablet 1    albuterol  (VENTOLIN  HFA) 108 (90 Base) MCG/ACT inhaler INHALE 1 TO 2 PUFFS BY MOUTH EVERY 6 HOURS AS NEEDED FOR WHEEZING FOR SHORTNESS OF BREATH 7 g 0   meclizine  (ANTIVERT ) 12.5 MG tablet Take 1 tablet (12.5 mg total) by mouth 3 (three) times daily as needed for dizziness. 30 tablet 0   predniSONE  (DELTASONE ) 20 MG tablet Take 2 tablets daily with breakfast. 10 tablet 0   No facility-administered medications prior to visit.     Per HPI unless specifically indicated in ROS section below Review of Systems  Constitutional:  Negative for fatigue and fever.  HENT:  Positive for congestion, ear pain and sinus pressure. Negative for ear discharge.   Eyes:  Negative for pain.  Respiratory:  Negative for cough and shortness of breath.   Cardiovascular:  Negative for chest pain, palpitations and leg swelling.  Gastrointestinal:  Negative for abdominal pain.  Genitourinary:  Negative for dysuria and vaginal bleeding.  Musculoskeletal:  Negative for back pain.  Neurological:  Negative for syncope, light-headedness and headaches.  Psychiatric/Behavioral:  Negative for dysphoric mood.    Objective:  BP 110/60   Pulse 84   Temp 98.6 F (37 C) (Temporal)   Ht 5' 3 (1.6 m)   Wt 206 lb 8 oz (93.7 kg)   LMP 12/17/2023 (Exact Date)   SpO2 95%   BMI 36.58 kg/m  Wt Readings from Last 3 Encounters:  12/28/23 206 lb 8 oz (93.7 kg)  07/17/23 200 lb (90.7 kg)  05/15/23 198 lb (89.8 kg)      Physical Exam Constitutional:      General: She is not in acute distress.    Appearance: She is well-developed. She is not ill-appearing or toxic-appearing.  HENT:     Head: Normocephalic.     Right Ear: Hearing, ear canal and external ear normal. A middle ear effusion is present. Tympanic membrane is bulging. Tympanic membrane is not erythematous or retracted.     Left Ear: Hearing, ear canal and external ear normal. A middle ear effusion is present. Tympanic membrane is bulging. Tympanic membrane is not  erythematous or retracted.     Nose: Mucosal edema and rhinorrhea present.     Right Sinus: No maxillary sinus tenderness or frontal sinus tenderness.     Left Sinus: No maxillary sinus tenderness or frontal sinus tenderness.     Mouth/Throat:     Pharynx: Uvula midline.  Eyes:     General: Lids are normal. Lids are everted, no foreign bodies appreciated.     Conjunctiva/sclera: Conjunctivae normal.     Pupils: Pupils are equal, round, and reactive to light.  Neck:     Thyroid : No thyroid  mass or thyromegaly.     Vascular: No carotid bruit.     Trachea: Trachea normal.  Cardiovascular:     Rate and Rhythm: Normal rate and regular rhythm.     Pulses: Normal pulses.     Heart sounds: Normal heart sounds, S1 normal and S2 normal. No murmur heard.    No friction rub. No gallop.  Pulmonary:     Effort: Pulmonary effort is normal. No tachypnea or respiratory distress.     Breath sounds: Normal breath sounds. No decreased breath sounds, wheezing, rhonchi or rales.  Musculoskeletal:     Cervical back: Normal range of motion and neck supple.  Skin:    General: Skin is warm and dry.     Findings: No rash.  Neurological:     Mental Status: She is alert.  Psychiatric:        Mood and Affect: Mood is not anxious or depressed.        Speech: Speech normal.        Behavior: Behavior normal. Behavior is cooperative.        Judgment: Judgment normal.       Results for orders placed or performed during the hospital encounter of 06/13/23  POC Covid19/Flu A&B Antigen   Collection Time: 06/13/23  1:52 PM  Result Value Ref Range   Influenza A Antigen, POC Negative    Influenza B Antigen, POC Negative    Covid Antigen, POC Negative     Assessment and Plan  Acute dysfunction of Eustachian tube, bilateral Assessment & Plan: Acute, some improvement especially with vertigo with prednisone  course. Patient with continued fluid behind bilateral eardrums, possible associated sinus  infection.  Will treat with longer prednisone  taper 40 mg x 5 days, then 20 mg x 5 days. She will add nasal saline irrigation and continue allergy medication Zyrtec at bedtime.   Acute non-recurrent sinusitis, unspecified location Assessment & Plan: Acute, ongoing greater than 2 weeks, concern for bacterial superinfection.  Will treat with amoxicillin  500 mg 2 tablets twice daily x 10 days. Patient always gets yeast infection following amoxicillin  so I's also sent a prescription for fluconazole  150 mg p.o. x 1, may repeat in  2 days if not improving as expected.  Return and ER precautions provided.   Moderate persistent asthma without complication Assessment & Plan: Chronic, no current asthma exacerbation.  Albuterol  refilled as requested.  Orders: -     Albuterol  Sulfate HFA; Inhale 1-2 puffs into the lungs every 6 (six) hours as needed for wheezing or shortness of breath.  Dispense: 7 g; Refill: 0  Other orders -     predniSONE ; 2  tabs po daily x 5 days then 1 tab daily x 5 days  Dispense: 15 tablet; Refill: 0 -     Amoxicillin ; Take 2 capsules (1,000 mg total) by mouth 2 (two) times daily.  Dispense: 40 capsule; Refill: 0 -     Fluconazole ; Take 1 tab po today, repeat in  2 days if needed  Dispense: 2 tablet; Refill: 0    No follow-ups on file.   Greig Ring, MD

## 2024-01-15 ENCOUNTER — Ambulatory Visit: Payer: Self-pay

## 2024-01-15 NOTE — Telephone Encounter (Signed)
 FYI Only or Action Required?: FYI only for provider.  Patient was last seen in primary care on 12/28/2023 by Bonnie Greig BRAVO, MD.  Called Nurse Triage reporting Shoulder Pain.  Symptoms began several weeks ago.  Interventions attempted: OTC medications: Tylenol  and Rest, hydration, or home remedies.  Symptoms are: unchanged.  Triage Disposition: See PCP When Office is Open (Within 3 Days)  Patient/caregiver understands and will follow disposition?: Yes  Copied from CRM #[id number]. Topic: Clinical - Red Word Triage >> Jan 15, 2024  5:11 PM DeAngela L wrote: Red Word that prompted transfer to Nurse Triage: Patient having extreme shoulder and right arm pain the patient can't pull up or down her pants easily for the last 2 week and she has been experiencing the pain that  goes from the front of the shoulder down to her arm to her hand and its difficult and she can no longer deal with the pain at this time   Pt num [id number] (M) Reason for Disposition  [1] MODERATE pain (e.g., interferes with normal activities) AND [2] present > 3 days  Answer Assessment - Initial Assessment Questions 1. ONSET: When did the pain start?     1.5-2 weeks ago. 2. LOCATION: Where is the pain located?     Right shoulder  3. PAIN: How bad is the pain? (Scale 1-10; or mild, moderate, severe)     Moderate, worse after work shift 4. WORK OR EXERCISE: Has there been any recent work or exercise that involved this part of the body?     Works in [employer], uses her upper body repetitively  5. CAUSE: What do you think is causing the shoulder pain?     Unknown-no injury 6. OTHER SYMPTOMS: Do you have any other symptoms? (e.g., neck pain, swelling, rash, fever, numbness, weakness)     Difficulty writing, difficulty pulling up pants, difficulty raising arm if she is holding anything in her hand  Protocols used: Shoulder Pain-A-AH

## 2024-01-16 NOTE — Telephone Encounter (Signed)
 Noted and appreciate Dr. Daphine Deutscher evaluation.

## 2024-01-17 ENCOUNTER — Encounter: Payer: Self-pay | Admitting: Family Medicine

## 2024-01-17 ENCOUNTER — Ambulatory Visit (INDEPENDENT_AMBULATORY_CARE_PROVIDER_SITE_OTHER): Admitting: Family Medicine

## 2024-01-17 VITALS — BP 90/62 | HR 89 | Temp 97.6°F | Ht 63.0 in | Wt 206.5 lb

## 2024-01-17 DIAGNOSIS — G5601 Carpal tunnel syndrome, right upper limb: Secondary | ICD-10-CM | POA: Insufficient documentation

## 2024-01-17 DIAGNOSIS — M25511 Pain in right shoulder: Secondary | ICD-10-CM | POA: Diagnosis not present

## 2024-01-17 MED ORDER — MELOXICAM 15 MG PO TABS: 15.0000 mg | ORAL_TABLET | Freq: Every day | ORAL | 0 refills | Status: DC

## 2024-01-17 NOTE — Assessment & Plan Note (Signed)
 Acute, right thumb pain and tingling with positive Tinel and Phalen on exam.  Most consistent with rotator cuff tendinitis. She has been wearing a thumb brace but that does not restrict her flexion at the wrist.  She will instead changed to nighttime bracing with a carpal tunnel brace.  Restrictions for work provided for both shoulder and wrist issues.  Follow-up if not improving as expected.

## 2024-01-17 NOTE — Progress Notes (Signed)
 Patient ID: Bonnie Mathews, female    DOB: [birth year], 38 y.o.   MRN: [id number]  This visit was conducted in person.  BP 90/62   Pulse 89   Temp 97.6 F (36.4 C) (Temporal)   Ht 5' 3 (1.6 m)   Wt 206 lb 8 oz (93.7 kg)   LMP 01/10/2024 (Exact Date)   SpO2 97%   BMI 36.58 kg/m    CC:  Chief Complaint  Patient presents with   Shoulder Pain    Right    Subjective:   HPI: BEATRIS Mathews is a 38 y.o. female presenting on 01/17/2024 for Shoulder Pain (Right)  New onset pain in right shoulder off and on for for 2 months.  Pain in front and back of right shoulder... radiates to hand.   Pain with int, ext rotation, abduction and  Some numbness and tingling in hands... mainly thumb side. Dull pain in lateral neck.  Has been using heat/ice, Tens unit. Resistance bands.   Repetitiveness of arm use at work. Works at [employer].   Has been using tylenol  and ibuprofen  prn.   Saw Dr. Watt in past for past issue... treated with PT.  X-ray:  right shoulder 2018 :  IMPRESSION: 1. No evidence of fracture or dislocation. 2. Mild superior displacement of the distal clavicle with respect to the acromion raises concern for [place] type 2 acromioclavicular joint injury. Would correlate for any associated symptoms.    Just had prednisone   Relevant past medical, surgical, family and social history reviewed and updated as indicated. Interim medical history since our last visit reviewed. Allergies and medications reviewed and updated. Outpatient Medications Prior to Visit  Medication Sig Dispense Refill   albuterol  (PROVENTIL ) (2.5 MG/3ML) 0.083% nebulizer solution Take 3 mLs (2.5 mg total) by nebulization every 6 (six) hours as needed for wheezing or shortness of breath. 75 mL 12   albuterol  (VENTOLIN  HFA) 108 (90 Base) MCG/ACT inhaler Inhale 1-2 puffs into the lungs every 6 (six) hours as needed for wheezing or shortness of breath. 7 g 0   fluticasone -salmeterol (ADVAIR)  100-50 MCG/ACT AEPB Inhale 1 puff into the lungs 2 (two) times daily. 60 each 5   sertraline  (ZOLOFT ) 50 MG tablet Take 1 tablet (50 mg total) by mouth daily. For anxiety 90 tablet 1   amoxicillin  (AMOXIL ) 500 MG capsule Take 2 capsules (1,000 mg total) by mouth 2 (two) times daily. 40 capsule 0   fluconazole  (DIFLUCAN ) 150 MG tablet Take 1 tab po today, repeat in  2 days if needed 2 tablet 0   predniSONE  (DELTASONE ) 20 MG tablet 2  tabs po daily x 5 days then 1 tab daily x 5 days 15 tablet 0   No facility-administered medications prior to visit.     Per HPI unless specifically indicated in ROS section below Review of Systems  Constitutional:  Negative for fatigue and fever.  HENT:  Negative for congestion.   Eyes:  Negative for pain.  Respiratory:  Negative for cough and shortness of breath.   Cardiovascular:  Negative for chest pain, palpitations and leg swelling.  Gastrointestinal:  Negative for abdominal pain.  Genitourinary:  Negative for dysuria and vaginal bleeding.  Musculoskeletal:  Negative for back pain.  Neurological:  Negative for syncope, light-headedness and headaches.  Psychiatric/Behavioral:  Negative for dysphoric mood.    Objective:  BP 90/62   Pulse 89   Temp 97.6 F (36.4 C) (Temporal)   Ht 5' 3 (1.6 m)  Wt 206 lb 8 oz (93.7 kg)   LMP 01/10/2024 (Exact Date)   SpO2 97%   BMI 36.58 kg/m   Wt Readings from Last 3 Encounters:  01/17/24 206 lb 8 oz (93.7 kg)  12/28/23 206 lb 8 oz (93.7 kg)  07/17/23 200 lb (90.7 kg)      Physical Exam Constitutional:      General: She is not in acute distress.    Appearance: Normal appearance. She is well-developed. She is not ill-appearing or toxic-appearing.  HENT:     Head: Normocephalic.     Right Ear: Hearing, tympanic membrane, ear canal and external ear normal. Tympanic membrane is not erythematous, retracted or bulging.     Left Ear: Hearing, tympanic membrane, ear canal and external ear normal. Tympanic  membrane is not erythematous, retracted or bulging.     Nose: No mucosal edema or rhinorrhea.     Right Sinus: No maxillary sinus tenderness or frontal sinus tenderness.     Left Sinus: No maxillary sinus tenderness or frontal sinus tenderness.     Mouth/Throat:     Pharynx: Uvula midline.  Eyes:     General: Lids are normal. Lids are everted, no foreign bodies appreciated.     Conjunctiva/sclera: Conjunctivae normal.     Pupils: Pupils are equal, round, and reactive to light.  Neck:     Thyroid : No thyroid  mass or thyromegaly.     Vascular: No carotid bruit.     Trachea: Trachea normal.  Cardiovascular:     Rate and Rhythm: Normal rate and regular rhythm.     Pulses: Normal pulses.     Heart sounds: Normal heart sounds, S1 normal and S2 normal. No murmur heard.    No friction rub. No gallop.  Pulmonary:     Effort: Pulmonary effort is normal. No tachypnea or respiratory distress.     Breath sounds: Normal breath sounds. No decreased breath sounds, wheezing, rhonchi or rales.  Abdominal:     General: Bowel sounds are normal.     Palpations: Abdomen is soft.     Tenderness: There is no abdominal tenderness.  Musculoskeletal:     Right shoulder: Tenderness present. No swelling, deformity, bony tenderness or crepitus. Decreased range of motion. Normal strength.     Right upper arm: Normal.     Right elbow: Normal.     Right wrist: Tenderness present. Decreased range of motion.     Cervical back: Normal range of motion and neck supple.     Comments: Tender to palpation over wrist and thumb Numbness in radial distribution.,  Negative Spurling's. Positive Tinel and Phalen.  Skin:    General: Skin is warm and dry.     Findings: No rash.  Neurological:     Mental Status: She is alert.  Psychiatric:        Mood and Affect: Mood is not anxious or depressed.        Speech: Speech normal.        Behavior: Behavior normal. Behavior is cooperative.        Thought Content: Thought  content normal.        Judgment: Judgment normal.       Results for orders placed or performed during the hospital encounter of 06/13/23  POC Covid19/Flu A&B Antigen   Collection Time: 06/13/23  1:52 PM  Result Value Ref Range   Influenza A Antigen, POC Negative    Influenza B Antigen, POC Negative    Covid Antigen, POC  Negative     Assessment and Plan  Acute pain of right shoulder Assessment & Plan: Acute on chronic right shoulder pain, now currently with signs and symptoms of rotator cuff tendinitis. Patient does have some tingling and radiation of pain down arm and in thumb but this does not seem to be clearly associated with neck source of pain but instead carpal tunnel.  Shoulder evaluation is consistent with rotator cuff tendinitis.  Will avoid repeat course of prednisone  given she had recently.  Will instead treat with meloxicam  15 mg p.o. daily with meal.  Chose this anti-inflammatory given her history of stomach issues.  Start home physical therapy.  Follow-up with Dr. Koplan for possible steroid injection if not improving as expected.   Acute carpal tunnel syndrome of right wrist Assessment & Plan: Acute, right thumb pain and tingling with positive Tinel and Phalen on exam.  Most consistent with rotator cuff tendinitis. She has been wearing a thumb brace but that does not restrict her flexion at the wrist.  She will instead changed to nighttime bracing with a carpal tunnel brace.  Restrictions for work provided for both shoulder and wrist issues.  Follow-up if not improving as expected.   Other orders -     Meloxicam ; Take 1 tablet (15 mg total) by mouth daily.  Dispense: 30 tablet; Refill: 0    No follow-ups on file.   Greig Ring, MD

## 2024-01-17 NOTE — Assessment & Plan Note (Signed)
 Acute on chronic right shoulder pain, now currently with signs and symptoms of rotator cuff tendinitis. Patient does have some tingling and radiation of pain down arm and in thumb but this does not seem to be clearly associated with neck source of pain but instead carpal tunnel.  Shoulder evaluation is consistent with rotator cuff tendinitis.  Will avoid repeat course of prednisone  given she had recently.  Will instead treat with meloxicam  15 mg p.o. daily with meal.  Chose this anti-inflammatory given her history of stomach issues.  Start home physical therapy.  Follow-up with Dr. Koplan for possible steroid injection if not improving as expected.

## 2024-02-13 ENCOUNTER — Other Ambulatory Visit: Payer: Self-pay | Admitting: Family Medicine

## 2024-02-13 NOTE — Telephone Encounter (Signed)
 Last office visit 01/17/2024 with Dr. Avelina for acute right shoulder pain.  Last refilled 01/17/2024 by Dr. Avelina.  Next Appt:  No future appointments.  Refill?

## 2024-02-25 ENCOUNTER — Ambulatory Visit
Admission: RE | Admit: 2024-02-25 | Discharge: 2024-02-25 | Disposition: A | Discharge status: Home / Self Care | Source: Ambulatory Visit | Attending: Nurse Practitioner | Admitting: Nurse Practitioner

## 2024-02-25 VITALS — BP 118/66 | HR 93 | Temp 98.1°F | Resp 17

## 2024-02-25 DIAGNOSIS — H8113 Benign paroxysmal vertigo, bilateral: Secondary | ICD-10-CM

## 2024-02-25 MED ORDER — MECLIZINE HCL 25 MG PO TABS: 25.0000 mg | ORAL_TABLET | Freq: Three times a day (TID) | ORAL | 0 refills | Status: AC | PRN

## 2024-02-25 NOTE — ED Provider Notes (Signed)
 [facility]    CSN: [id number] Arrival date & time: 02/25/24  1456      History   Chief Complaint Chief Complaint  Patient presents with   Ear Fullness    Having vertigo from fluid in my ears again. - Entered by patient    HPI Bonnie Mathews is a 38 y.o. female.   Discussed the use of AI scribe software for clinical note transcription with the patient, who gave verbal consent to proceed.   The patient presents with a history of intermittent vertigo and reports an acute episode that began last night. The current episode was triggered when she turned her head to look inside the refrigerator, at which time she experienced a spinning sensation, describing both herself and the environment as spinning. She identifies this as day one of the current episode, noting that previous episodes have typically lasted around four days.  Associated symptoms include a mild headache and intermittent nausea. She experienced one episode of vomiting when the vertigo first started but has not vomited since. She reports a sensation of pressure in the neck area and occasional brief twinges of pain in the ears but denies tinnitus or hearing loss.  The patient denies numbness, tingling, or weakness in the face or extremities, as well as visual changes, light sensitivity, chest pain, shortness of breath, palpitations, neck pain, or stiffness. She also denies recent travel, including air travel, mountain trips, or scuba diving.  The following sections of the patient's history were reviewed and updated as appropriate: allergies, current medications, past family history, past medical history, past social history, past surgical history, and problem list.      Past Medical History:  Diagnosis Date   Chickenpox    Chronic cough 07/15/2019   Dilated cardiomyopathy (HCC)    GERD (gastroesophageal reflux disease)    Numbness and tingling of right lower extremity 07/15/2019   Numbness and tingling of  right upper extremity 06/09/2015   Peptic ulcer    Postcoital bleeding 12/23/2018   Present since 2016.    Normal exam   TSH, A1c drawn   Pap done   Change to low dose OCPs     Shortness of breath 07/15/2019   Tobacco abuse     Patient Active Problem List   Diagnosis Date Noted   Acute carpal tunnel syndrome of right wrist 01/17/2024   Acute dysfunction of Eustachian tube, bilateral 12/28/2023   Acute non-recurrent sinusitis 12/28/2023   GAD (generalized anxiety disorder) 05/15/2023   Asthma 09/30/2019   Encounter for annual general medical examination with abnormal findings in adult 12/23/2018   Chronic foot pain 07/09/2017   Acute pain of right shoulder 07/09/2017   GERD (gastroesophageal reflux disease) 07/09/2017   ALLERGIC RHINITIS, SEASONAL 03/06/2008   Tobacco abuse 02/14/2008    Past Surgical History:  Procedure Laterality Date   FOOT SURGERY Right 2020   lipoma removed from arch   TONSILLECTOMY AND ADENOIDECTOMY  1993    OB History   No obstetric history on file.      Home Medications    Prior to Admission medications   Medication Sig Start Date End Date Taking? Authorizing Provider  meclizine  (ANTIVERT ) 25 MG tablet Take 1 tablet (25 mg total) by mouth 3 (three) times daily as needed (vertigo). 02/25/24  Yes Iola Lukes, FNP  albuterol  (PROVENTIL ) (2.5 MG/3ML) 0.083% nebulizer solution Take 3 mLs (2.5 mg total) by nebulization every 6 (six) hours as needed for wheezing or shortness of breath. 06/13/23  Enedelia Dorna HERO, FNP  albuterol  (VENTOLIN  HFA) 108 (90 Base) MCG/ACT inhaler Inhale 1-2 puffs into the lungs every 6 (six) hours as needed for wheezing or shortness of breath. 12/28/23   Bedsole, Amy E, MD  fluticasone -salmeterol (ADVAIR) 100-50 MCG/ACT AEPB Inhale 1 puff into the lungs 2 (two) times daily. 11/20/23   Gretta Comer POUR, NP  meloxicam  (MOBIC ) 15 MG tablet TAKE 1 TABLET(15 MG) BY MOUTH DAILY 02/13/24   Bedsole, Amy E, MD  sertraline   (ZOLOFT ) 50 MG tablet Take 1 tablet (50 mg total) by mouth daily. For anxiety 11/20/23   Gretta Comer POUR, NP    Family History Family History  Problem Relation Age of Onset   Alcohol abuse Paternal Grandfather    Mental illness Paternal Grandfather    Arthritis Mother    Heart disease Mother    Diabetes Mother    Hypertension Father    Arthritis Maternal Aunt    Breast cancer Maternal Aunt    Alcohol abuse Paternal Uncle    Mental illness Maternal Grandmother    Mental illness Maternal Grandfather    Colon cancer Paternal Grandmother    Mental illness Paternal Grandmother     Social History Social History   Tobacco Use   Smoking status: Former    Current packs/day: 0.50    Average packs/day: 0.5 packs/day for 17.0 years (8.5 ttl pk-yrs)    Types: Cigarettes   Smokeless tobacco: Never  Vaping Use   Vaping status: Every Day  Substance Use Topics   Alcohol use: Not Currently    Comment: Occ   Drug use: Yes    Types: Marijuana    Comment: Daily     Allergies   Doxycycline   Review of Systems Review of Systems  Constitutional:  Negative for appetite change.  HENT:  Negative for congestion, ear discharge, ear pain, hearing loss and tinnitus.   Eyes:  Positive for visual disturbance. Negative for photophobia.  Cardiovascular:  Negative for chest pain, palpitations and leg swelling.  Gastrointestinal:  Positive for nausea (intermittent) and vomiting (once yesterday).  Musculoskeletal:  Negative for neck pain and neck stiffness.  Neurological:  Positive for dizziness and headaches (mild). Negative for speech difficulty, weakness and numbness.  Psychiatric/Behavioral:  Negative for confusion.   All other systems reviewed and are negative.    Physical Exam Triage Vital Signs ED Triage Vitals  Encounter Vitals Group     BP 02/25/24 1500 118/66     Girls Systolic BP Percentile --      Girls Diastolic BP Percentile --      Boys Systolic BP Percentile --       Boys Diastolic BP Percentile --      Pulse Rate 02/25/24 1500 93     Resp 02/25/24 1500 17     Temp 02/25/24 1500 98.1 F (36.7 C)     Temp Source 02/25/24 1500 Oral     SpO2 02/25/24 1500 98 %     Weight --      Height --      Head Circumference --      Peak Flow --      Pain Score 02/25/24 1504 0     Pain Loc --      Pain Education --      Exclude from Growth Chart --    No data found.  Updated Vital Signs BP 118/66 (BP Location: Right Arm)   Pulse 93   Temp 98.1 F (36.7 C) (Oral)  Resp 17   LMP 02/04/2024 (Exact Date)   SpO2 98%   Visual Acuity Right Eye Distance:   Left Eye Distance:   Bilateral Distance:    Right Eye Near:   Left Eye Near:    Bilateral Near:     Physical Exam Vitals reviewed.  Constitutional:      General: She is awake. She is not in acute distress.    Appearance: Normal appearance. She is well-developed. She is not ill-appearing, toxic-appearing or diaphoretic.  HENT:     Head: Normocephalic.     Right Ear: Hearing, tympanic membrane, ear canal and external ear normal.     Left Ear: Hearing, tympanic membrane, ear canal and external ear normal.     Nose: Nose normal.     Mouth/Throat:     Mouth: Mucous membranes are moist.     Pharynx: Oropharynx is clear. Uvula midline.  Eyes:     General: Lids are normal. Vision grossly intact. Gaze aligned appropriately.     Extraocular Movements: Extraocular movements intact.     Right eye: Normal extraocular motion and no nystagmus.     Left eye: Normal extraocular motion and no nystagmus.     Conjunctiva/sclera: Conjunctivae normal.     Pupils: Pupils are equal, round, and reactive to light.     Visual Fields: Right eye visual fields normal and left eye visual fields normal.  Cardiovascular:     Rate and Rhythm: Normal rate.     Pulses: Normal pulses.     Heart sounds: Normal heart sounds.  Pulmonary:     Effort: Pulmonary effort is normal.     Breath sounds: Normal breath sounds and air  entry.  Abdominal:     Palpations: Abdomen is soft.  Musculoskeletal:        General: Normal range of motion.     Cervical back: Full passive range of motion without pain, normal range of motion and neck supple. No rigidity or tenderness. No pain with movement.  Skin:    General: Skin is warm and dry.  Neurological:     General: No focal deficit present.     Mental Status: She is alert and oriented to person, place, and time.     Cranial Nerves: No cranial nerve deficit.     Sensory: Sensation is intact. No sensory deficit.     Motor: Motor function is intact. No weakness.     Coordination: Coordination is intact.     Gait: Gait is intact.  Psychiatric:        Mood and Affect: Mood and affect normal.        Speech: Speech normal.        Behavior: Behavior is cooperative.      UC Treatments / Results  Labs (all labs ordered are listed, but only abnormal results are displayed) Labs Reviewed - No data to display  EKG   Radiology No results found.  Procedures Procedures (including critical care time)  Medications Ordered in UC Medications - No data to display  Initial Impression / Assessment and Plan / UC Course  I have reviewed the triage vital signs and the nursing notes.  Pertinent labs & imaging results that were available during my care of the patient were reviewed by me and considered in my medical decision making (see chart for details).     The patient presents with symptoms consistent with vertigo. No focal neurological deficits or red flag findings were identified on examination. Presentation is most  consistent with benign positional or peripheral vertigo. Meclizine  prescribed for symptomatic relief. Patient educated on condition, advised to avoid driving or operating heavy machinery while symptomatic or taking medication due to possible drowsiness. Recommended follow-up with ENT and/or neurology for further evaluation if symptoms persist or worsen. Emergency  department precautions discussed, including sudden worsening of dizziness, new headache, weakness, vision changes, slurred speech, or difficulty walking.  Today's evaluation has revealed no signs of a dangerous process. Discussed diagnosis with patient and/or guardian. Patient and/or guardian aware of their diagnosis, possible red flag symptoms to watch out for and need for close follow up. Patient and/or guardian understands verbal and written discharge instructions. Patient and/or guardian comfortable with plan and disposition.  Patient and/or guardian has a clear mental status at this time, good insight into illness (after discussion and teaching) and has clear judgment to make decisions regarding their care  Documentation was completed with the aid of voice recognition software. Transcription may contain typographical errors.  Final Clinical Impressions(s) / UC Diagnoses   Final diagnoses:  Benign paroxysmal positional vertigo due to bilateral vestibular disorder     Discharge Instructions      You were seen today for dizziness consistent with vertigo. Your exam did not show any signs of a serious neurological problem, and your symptoms are most likely due to an inner ear imbalance or irritation. You were prescribed Meclizine  to help with dizziness. This medication may cause drowsiness, so do not drive, operate machinery, or perform tasks that require focus until you know how it affects you. To help manage symptoms, move slowly when changing positions, such as when getting out of bed or turning your head. Rest as needed, stay well hydrated, and avoid sudden movements that make the dizziness worse. Sleeping with your head slightly elevated may also help.  Follow up with your primary care provider or an ear, nose, and throat (ENT) specialist if your symptoms do not improve within a few days, if dizziness recurs frequently, or if you experience hearing loss, ringing in the ears, or balance  problems. A neurology referral may also be considered if symptoms persist or worsen. Go to the emergency department immediately if you develop severe or persistent dizziness that prevents you from standing or walking, sudden severe headache, vision changes, slurred speech, weakness, numbness, chest pain, or fainting.     ED Prescriptions     Medication Sig Dispense Auth. Provider   meclizine  (ANTIVERT ) 25 MG tablet Take 1 tablet (25 mg total) by mouth 3 (three) times daily as needed (vertigo). 12 tablet Iola Lukes, FNP      PDMP not reviewed this encounter.   Iola Lukes, [place] 02/25/24 [phone]

## 2024-02-25 NOTE — Discharge Instructions (Addendum)
 You were seen today for dizziness consistent with vertigo. Your exam did not show any signs of a serious neurological problem, and your symptoms are most likely due to an inner ear imbalance or irritation. You were prescribed Meclizine  to help with dizziness. This medication may cause drowsiness, so do not drive, operate machinery, or perform tasks that require focus until you know how it affects you. To help manage symptoms, move slowly when changing positions, such as when getting out of bed or turning your head. Rest as needed, stay well hydrated, and avoid sudden movements that make the dizziness worse. Sleeping with your head slightly elevated may also help.  Follow up with your primary care provider or an ear, nose, and throat (ENT) specialist if your symptoms do not improve within a few days, if dizziness recurs frequently, or if you experience hearing loss, ringing in the ears, or balance problems. A neurology referral may also be considered if symptoms persist or worsen. Go to the emergency department immediately if you develop severe or persistent dizziness that prevents you from standing or walking, sudden severe headache, vision changes, slurred speech, weakness, numbness, chest pain, or fainting.

## 2024-02-25 NOTE — ED Triage Notes (Signed)
 Pt c/o vertigo that began last night after she turned her head to look in the refrigerator.   States she had this happen back in August and was on steroid pack.

## 2024-02-29 NOTE — Telephone Encounter (Unsigned)
 Copied from CRM [id number]. Topic: Clinical - Medication Refill >> Feb 29, 2024 10:13 AM Harlene ORN wrote: Medication: fluticasone -salmeterol (ADVAIR) 100-50 MCG/ACT AEPB (90 day supply)  Has the patient contacted their pharmacy? Yes (Agent: If no, request that the patient contact the pharmacy for the refill. If patient does not wish to contact the pharmacy document the reason why and proceed with request.) (Agent: If yes, when and what did the pharmacy advise?)  This is the patient's preferred pharmacy:  [facility] - [place], [place] - [address] [place] [employer] [place] [address] Phone: [phone] Fax: [phone]  Is this the correct pharmacy for this prescription? Yes If no, delete pharmacy and type the correct one.   Has the prescription been filled recently? Yes  Is the patient out of the medication? No  Has the patient been seen for an appointment in the last year OR does the patient have an upcoming appointment? Yes  Can we respond through MyChart? Yes  Agent: Please be advised that Rx refills may take up to 3 business days. We ask that you follow-up with your pharmacy.

## 2024-03-07 ENCOUNTER — Telehealth: Payer: Self-pay | Admitting: Primary Care

## 2024-03-07 ENCOUNTER — Other Ambulatory Visit: Payer: Self-pay | Admitting: Primary Care

## 2024-03-07 DIAGNOSIS — J4540 Moderate persistent asthma, uncomplicated: Secondary | ICD-10-CM

## 2024-03-07 MED ORDER — FLUTICASONE-SALMETEROL 100-50 MCG/ACT IN AEPB: 1.0000 | INHALATION_SPRAY | Freq: Two times a day (BID) | RESPIRATORY_TRACT | 0 refills | Status: DC

## 2024-03-07 NOTE — Telephone Encounter (Signed)
 Copied from CRM #[id number]. Topic: Clinical - Medication Refill >> Mar 07, 2024  4:17 PM Tysheama G wrote: Medication: fluticasone -salmeterol (ADVAIR) 100-50 MCG/ACT AEPB  Has the patient contacted their pharmacy? Yes (Agent: If no, request that the patient contact the pharmacy for the refill. If patient does not wish to contact the pharmacy document the reason why and proceed with request.) (Agent: If yes, when and what did the pharmacy advise?)  This is the patient's preferred pharmacy:  [facility] - [place], [place] - [address] [place] [address] Phone: [phone] Fax: [phone]    Is this the correct pharmacy for this prescription? Yes If no, delete pharmacy and type the correct one.   Has the prescription been filled recently? Yes  Is the patient out of the medication? No  Has the patient been seen for an appointment in the last year OR does the patient have an upcoming appointment? Yes  Can we respond through MyChart? Yes  Agent: Please be advised that Rx refills may take up to 3 business days. We ask that you follow-up with your pharmacy.

## 2024-03-07 NOTE — Telephone Encounter (Signed)
 Refill(s) sent to pharmacy. I see that she is scheduled for CPE on 04/04/2024.  Her last CPE was 05/19/2023.  Does she have new insurance?  If not, then I recommend she move her physical back to late January or early February 2026

## 2024-03-07 NOTE — Addendum Note (Signed)
 Addended by: Rishawn Walck K on: 03/07/2024 04:55 PM   Modules accepted: Orders

## 2024-03-07 NOTE — Telephone Encounter (Signed)
 Copied from CRM #[id number]. Topic: Clinical - Prescription Issue >> Mar 07, 2024  4:20 PM Bonnie Mathews wrote: Reason for CRM: Patient is all out of her medication fluticasone -salmeterol (ADVAIR) 100-50 MCG/ACT AEPB and she [facility] but she's asking if Dr.Clark can send her a 30day supply today so it can hold her over till [facility]. And can she send it to   [id number] [place], [place] - [address] AT [facility] & [address] [place] [address] Phone: [phone] Fax: [phone]

## 2024-03-10 NOTE — Telephone Encounter (Signed)
 I sent 6 months worth of refills on 11/20/23 to [facility]. Has she contacted her pharmacy? She should have enough to last until late January 2026.  Also, I see that 2 appointments are scheduled for basically the same thing. One in December and one in January. She would need the one in January and not December.

## 2024-03-10 NOTE — Telephone Encounter (Signed)
 Called and schedule pt for Cpe

## 2024-03-11 NOTE — Telephone Encounter (Signed)
 Spoke with pt relaying Kate's message. Pt verbalizes understanding and states [facility] is requests 90-day rxs. However, she will still contact them once she's home to see what else she can find out.   Also, pt agrees to canceling 04/04/24 OV and will see Mallie 05/15/24.   C/x 04/04/24 OV.

## 2024-04-04 ENCOUNTER — Ambulatory Visit: Admitting: Primary Care

## 2024-04-15 ENCOUNTER — Telehealth: Payer: Self-pay | Admitting: Primary Care

## 2024-04-28 ENCOUNTER — Other Ambulatory Visit: Payer: Self-pay | Admitting: Primary Care

## 2024-04-28 DIAGNOSIS — J4540 Moderate persistent asthma, uncomplicated: Secondary | ICD-10-CM

## 2024-04-28 NOTE — Telephone Encounter (Unsigned)
 Copied from CRM #[id number]. Topic: Clinical - Medication Refill >> Apr 28, 2024 10:24 AM Hadassah PARAS wrote: Medication: fluticasone -salmeterol (ADVAIR) 100-50 MCG/ACT AEPB (pt is needing 3 month supply)  Has the patient contacted their pharmacy? Yes (Agent: If no, request that the patient contact the pharmacy for the refill. If patient does not wish to contact the pharmacy document the reason why and proceed with request.) (Agent: If yes, when and what did the pharmacy advise?)  This is the patient's preferred pharmacy:  [facility] - [place], [place] - [address] [place] [address] Phone: [phone] Fax: [phone]    Is this the correct pharmacy for this prescription? Yes If no, delete pharmacy and type the correct one.   Has the prescription been filled recently? Yes  Is the patient out of the medication? No  Has the patient been seen for an appointment in the last year OR does the patient have an upcoming appointment? Yes  Can we respond through MyChart? No  Agent: Please be advised that Rx refills may take up to 3 business days. We ask that you follow-up with your pharmacy.

## 2024-04-29 MED ORDER — FLUTICASONE-SALMETEROL 100-50 MCG/ACT IN AEPB: 1.0000 | INHALATION_SPRAY | Freq: Two times a day (BID) | RESPIRATORY_TRACT | 0 refills | Status: AC

## 2024-04-29 NOTE — Telephone Encounter (Signed)
 Refills sent to pharmacy.

## 2024-05-15 ENCOUNTER — Encounter: Payer: Self-pay | Admitting: Primary Care

## 2024-05-15 ENCOUNTER — Ambulatory Visit: Admitting: Primary Care

## 2024-05-15 VITALS — BP 128/68 | HR 100 | Temp 97.9°F | Ht 62.75 in | Wt 217.0 lb

## 2024-05-15 DIAGNOSIS — K219 Gastro-esophageal reflux disease without esophagitis: Secondary | ICD-10-CM | POA: Diagnosis not present

## 2024-05-15 DIAGNOSIS — Z0001 Encounter for general adult medical examination with abnormal findings: Secondary | ICD-10-CM

## 2024-05-15 DIAGNOSIS — J4540 Moderate persistent asthma, uncomplicated: Secondary | ICD-10-CM | POA: Diagnosis not present

## 2024-05-15 DIAGNOSIS — F411 Generalized anxiety disorder: Secondary | ICD-10-CM | POA: Diagnosis not present

## 2024-05-15 DIAGNOSIS — R3589 Other polyuria: Secondary | ICD-10-CM

## 2024-05-15 DIAGNOSIS — Z23 Encounter for immunization: Secondary | ICD-10-CM

## 2024-05-15 MED ORDER — SERTRALINE HCL 100 MG PO TABS: 100.0000 mg | ORAL_TABLET | Freq: Every day | ORAL | 0 refills | Status: AC

## 2024-05-15 NOTE — Assessment & Plan Note (Signed)
 Stable overall.  Continue Advair 100-50 mcg 1 puff BID. Continue albuterol  inhaler.

## 2024-05-15 NOTE — Progress Notes (Signed)
 "  Subjective:    Patient ID: Bonnie Mathews, female    DOB: 19-Nov-1985, 39 y.o.   MRN: [id number]  Bonnie Mathews is a very pleasant 39 y.o. female who presents today for complete physical and follow up of chronic conditions.  She would also like to discuss depression/anxiety. Overall well managed on Zoloft  50 mg except the week before and during her menstrual cycle. During her cycle she will experience extreme irritability, feeling down/sad, not feeling herself. She denies SI/HI. She questions if we can do something for these symptoms.   She would also like to discuss urinary frequency. Chronic for the last 1 year, worse more recently. Also with polydipsia. She will urinate 3-4 times every hour. Sometimes urinary urgency. She's tried reducing caffeine and sugar intake. She is mostly drinking water (1 gallon per day), 1-2 cups of coffee daily, sugar free soda sometimes. She has a family history of diabetes in her mother.   Immunizations: -Tetanus: Completed in 2017 -Influenza: Influenza vaccine provided today.   -HPV: Completed series   Diet: Fair diet.  Exercise: No regular exercise.  Eye exam: Completed > 1 year ago  Dental exam: Completed > 1 year ago   Pap Smear: Completed in 2025  BP Readings from Last 3 Encounters:  05/15/24 128/68  02/25/24 118/66  01/17/24 90/62   Wt Readings from Last 3 Encounters:  05/15/24 217 lb (98.4 kg)  01/17/24 206 lb 8 oz (93.7 kg)  12/28/23 206 lb 8 oz (93.7 kg)       Review of Systems  Constitutional:  Negative for unexpected weight change.  HENT:  Negative for rhinorrhea.   Respiratory:  Negative for cough and shortness of breath.        Recovering from a cold  Cardiovascular:  Negative for chest pain.  Gastrointestinal:  Negative for constipation and diarrhea.  Endocrine: Positive for polydipsia and polyuria.  Genitourinary:  Negative for difficulty urinating and menstrual problem.  Musculoskeletal:  Negative for arthralgias and  myalgias.  Skin:  Negative for rash.  Allergic/Immunologic: Negative for environmental allergies.  Neurological:  Negative for dizziness and headaches.  Psychiatric/Behavioral:         See HPI         Past Medical History:  Diagnosis Date   Acute dysfunction of Eustachian tube, bilateral 12/28/2023   Acute non-recurrent sinusitis 12/28/2023   Allergy 20 years ago   Anxiety    Asthma    CHF (congestive heart failure) (HCC)    Chickenpox    Chronic cough 07/15/2019   COPD (chronic obstructive pulmonary disease) (HCC)    Depression    Dilated cardiomyopathy (HCC)    GERD (gastroesophageal reflux disease)    Hypertension    Neuromuscular disorder (HCC)    Numbness and tingling of right lower extremity 07/15/2019   Numbness and tingling of right upper extremity 06/09/2015   Peptic ulcer    Postcoital bleeding 12/23/2018   Present since 2016.    Normal exam   TSH, A1c drawn   Pap done   Change to low dose OCPs     Shortness of breath 07/15/2019   Sleep apnea    Substance abuse (HCC)    Tobacco abuse     Social History   Socioeconomic History   Marital status: Single    Spouse name: Not on file   Number of children: Not on file   Years of education: Not on file   Highest education level: GED or equivalent  Occupational History   Not on file  Tobacco Use   Smoking status: Former    Current packs/day: 0.50    Average packs/day: 0.5 packs/day for 17.0 years (8.5 ttl pk-yrs)    Types: Cigarettes   Smokeless tobacco: Never  Vaping Use   Vaping status: Every Day  Substance and Sexual Activity   Alcohol use: Not Currently    Comment: Occ   Drug use: Yes    Types: Marijuana    Comment: Daily   Sexual activity: Yes  Other Topics Concern   Not on file  Social History Narrative   Single.   1 child.   Works as a production designer, theatre/television/film at [employer]   Enjoys working out and watching TV.   She's working on her GED.    Social Drivers of Health   Tobacco Use: Medium Risk  (05/15/2024)   Patient History    Smoking Tobacco Use: Former    Smokeless Tobacco Use: Never    Passive Exposure: Not on file  Financial Resource Strain: Medium Risk (01/16/2024)   Overall Financial Resource Strain (CARDIA)    Difficulty of Paying Living Expenses: Somewhat hard  Food Insecurity: Food Insecurity Present (01/16/2024)   Epic    Worried About Programme Researcher, Broadcasting/film/video in the Last Year: Sometimes true    Ran Out of Food in the Last Year: Sometimes true  Transportation Needs: No Transportation Needs (01/16/2024)   Epic    Lack of Transportation (Medical): No    Lack of Transportation (Non-Medical): No  Physical Activity: Unknown (01/16/2024)   Exercise Vital Sign    Days of Exercise per Week: 4 days    Minutes of Exercise per Session: Patient declined  Stress: No Stress Concern Present (01/16/2024)   [employer] of Occupational Health - Occupational Stress Questionnaire    Feeling of Stress: Only a little  Social Connections: Socially Isolated (01/16/2024)   Social Connection and Isolation Panel    Frequency of Communication with Friends and Family: More than three times a week    Frequency of Social Gatherings with Friends and Family: More than three times a week    Attends Religious Services: Never    Database Administrator or Organizations: No    Attends Engineer, Structural: Not on file    Marital Status: Divorced  Intimate Partner Violence: Not on file  Depression (PHQ2-9): Medium Risk (05/15/2024)   Depression (PHQ2-9)    PHQ-2 Score: 9  Alcohol Screen: Low Risk (05/11/2023)   Alcohol Screen    Last Alcohol Screening Score (AUDIT): 0  Housing: Unknown (01/16/2024)   Epic    Unable to Pay for Housing in the Last Year: No    Number of Times Moved in the Last Year: Not on file    Homeless in the Last Year: No  Utilities: Not on file  Health Literacy: Not on file    Past Surgical History:  Procedure Laterality Date   FOOT SURGERY Right 2020   lipoma  removed from arch   TONSILLECTOMY AND ADENOIDECTOMY  1993    Family History  Problem Relation Age of Onset   Alcohol abuse Paternal Grandfather    Mental illness Paternal Grandfather    Arthritis Mother    Heart disease Mother    Diabetes Mother    Anxiety disorder Mother    Asthma Mother    Hypertension Father    Arthritis Father    Arthritis Maternal Aunt    Breast cancer Maternal Aunt  Alcohol abuse Paternal Uncle    Mental illness Maternal Grandmother    Alcohol abuse Maternal Grandmother    Mental illness Maternal Grandfather    Colon cancer Paternal Grandmother    Mental illness Paternal Grandmother     Allergies[1]  Medications Ordered Prior to Encounter[2]  BP 128/68   Pulse 100   Temp 97.9 F (36.6 C) (Oral)   Ht 5' 2.75 (1.594 m)   Wt 217 lb (98.4 kg)   LMP 03/29/2024   SpO2 97%   BMI 38.75 kg/m  Objective:   Physical Exam HENT:     Right Ear: Tympanic membrane and ear canal normal.     Left Ear: Tympanic membrane and ear canal normal.  Eyes:     Pupils: Pupils are equal, round, and reactive to light.  Cardiovascular:     Rate and Rhythm: Normal rate and regular rhythm.  Pulmonary:     Effort: Pulmonary effort is normal.     Breath sounds: Examination of the right-upper field reveals wheezing. Examination of the left-upper field reveals wheezing. Examination of the right-lower field reveals wheezing. Examination of the left-lower field reveals wheezing. Wheezing present.  Abdominal:     General: Bowel sounds are normal.     Palpations: Abdomen is soft.     Tenderness: There is no abdominal tenderness.  Musculoskeletal:        General: Normal range of motion.     Cervical back: Neck supple.  Skin:    General: Skin is warm and dry.  Neurological:     Mental Status: She is alert and oriented to person, place, and time.     Cranial Nerves: No cranial nerve deficit.     Deep Tendon Reflexes:     Reflex Scores:      Patellar reflexes are 2+ on  the right side and 2+ on the left side. Psychiatric:        Mood and Affect: Mood normal.     Physical Exam        Assessment & Plan:  Encounter for annual general medical examination with abnormal findings in adult Assessment & Plan: Immunizations UTD. Influenza vaccine provided today.  Pap smear UTD.  Discussed the importance of a healthy diet and regular exercise in order for weight loss, and to reduce the risk of further co-morbidity.  Exam stable. Labs pending.  Follow up in 1 year for repeat physical.   Orders: -     Lipid panel -     Hemoglobin A1c -     TSH -     Comprehensive metabolic panel with GFR -     CBC  Moderate persistent asthma without complication Assessment & Plan: Stable overall.  Continue Advair 100-50 mcg 1 puff BID. Continue albuterol  inhaler.   Gastroesophageal reflux disease, unspecified whether esophagitis present Assessment & Plan: Controlled.  Avoiding triggers, continue same.   Polyuria Assessment & Plan: Labs pending today including A1c, CBC, TSH, CMP.  We also discussed to reduce intake of water to 64 oz daily. Continue to limit caffeine and sugary drinks.   Await results.    Orders: -     Lipid panel -     Hemoglobin A1c -     TSH -     Comprehensive metabolic panel with GFR -     CBC  GAD (generalized anxiety disorder) Assessment & Plan: Deteriorated.  Discussed options which include increasing Zoloft  to 100 mg daily, or continuing Zoloft  50 mg daily and adding buspirone  x 2 weeks/month.  We will start by increasing Zoloft  to 100 mg daily.  New prescription sent to pharmacy. She will update a few months  Orders: -     Sertraline  HCl; Take 1 tablet (100 mg total) by mouth daily. For anxiety  Dispense: 90 tablet; Refill: 0    Assessment and Plan Assessment & Plan         Comer MARLA Gaskins, NP       [1]  Allergies Allergen Reactions   Doxycycline Shortness Of Breath  [2]  Current  Outpatient Medications on File Prior to Visit  Medication Sig Dispense Refill   albuterol  (PROVENTIL ) (2.5 MG/3ML) 0.083% nebulizer solution Take 3 mLs (2.5 mg total) by nebulization every 6 (six) hours as needed for wheezing or shortness of breath. 75 mL 12   albuterol  (VENTOLIN  HFA) 108 (90 Base) MCG/ACT inhaler Inhale 1-2 puffs into the lungs every 6 (six) hours as needed for wheezing or shortness of breath. 7 g 0   BIOTIN PO Take by mouth daily.     Cyanocobalamin  (VITAMIN B12 PO) Take by mouth daily.     fluticasone -salmeterol (ADVAIR) 100-50 MCG/ACT AEPB Inhale 1 puff into the lungs 2 (two) times daily. 180 each 0   meclizine  (ANTIVERT ) 25 MG tablet Take 1 tablet (25 mg total) by mouth 3 (three) times daily as needed (vertigo). 12 tablet 0   No current facility-administered medications on file prior to visit.   "

## 2024-05-15 NOTE — Assessment & Plan Note (Signed)
 Labs pending today including A1c, CBC, TSH, CMP.  We also discussed to reduce intake of water to 64 oz daily. Continue to limit caffeine and sugary drinks.   Await results.

## 2024-05-15 NOTE — Assessment & Plan Note (Signed)
 Deteriorated.  Discussed options which include increasing Zoloft  to 100 mg daily, or continuing Zoloft  50 mg daily and adding buspirone x 2 weeks/month.  We will start by increasing Zoloft  to 100 mg daily.  New prescription sent to pharmacy. She will update a few months

## 2024-05-15 NOTE — Assessment & Plan Note (Signed)
 Controlled.  Avoiding triggers, continue same.

## 2024-05-15 NOTE — Assessment & Plan Note (Signed)
 Immunizations UTD. Influenza vaccine provided today. Pap smear UTD  Discussed the importance of a healthy diet and regular exercise in order for weight loss, and to reduce the risk of further co-morbidity.  Exam stable. Labs pending.  Follow up in 1 year for repeat physical.

## 2024-05-15 NOTE — Addendum Note (Signed)
 Addended by: JACKOLYN PLANAS on: 05/15/2024 03:07 PM   Modules accepted: Orders

## 2024-05-15 NOTE — Patient Instructions (Signed)
 Stop by the lab prior to leaving today. I will notify you of your results once received.   We increased the dose of your Zoloft  to 100 mg daily for anxiety.  Please update me in a few months as discussed  It was a pleasure to see you today!

## 2024-05-16 ENCOUNTER — Ambulatory Visit: Payer: Self-pay | Admitting: Primary Care

## 2024-05-16 DIAGNOSIS — R3589 Other polyuria: Secondary | ICD-10-CM

## 2024-05-16 LAB — CBC
HCT: 40.5 % (ref 36.0–46.0)
Hemoglobin: 13.8 g/dL (ref 12.0–15.0)
MCHC: 34.1 g/dL (ref 30.0–36.0)
MCV: 89.3 fl (ref 78.0–100.0)
Platelets: 300 K/uL (ref 150.0–400.0)
RBC: 4.54 Mil/uL (ref 3.87–5.11)
RDW: 13.4 % (ref 11.5–15.5)
WBC: 6.8 K/uL (ref 4.0–10.5)

## 2024-05-16 LAB — COMPREHENSIVE METABOLIC PANEL WITH GFR
ALT: 9 U/L (ref 3–35)
AST: 11 U/L (ref 5–37)
Albumin: 4.3 g/dL (ref 3.5–5.2)
Alkaline Phosphatase: 57 U/L (ref 39–117)
BUN: 15 mg/dL (ref 6–23)
CO2: 30 meq/L (ref 19–32)
Calcium: 9.4 mg/dL (ref 8.4–10.5)
Chloride: 101 meq/L (ref 96–112)
Creatinine, Ser: 0.57 mg/dL (ref 0.40–1.20)
GFR: 115.38 mL/min
Glucose, Bld: 76 mg/dL (ref 70–99)
Potassium: 4.2 meq/L (ref 3.5–5.1)
Sodium: 137 meq/L (ref 135–145)
Total Bilirubin: 0.3 mg/dL (ref 0.2–1.2)
Total Protein: 7.1 g/dL (ref 6.0–8.3)

## 2024-05-16 LAB — LIPID PANEL
Cholesterol: 150 mg/dL (ref 28–200)
HDL: 56.2 mg/dL
LDL Cholesterol: 81 mg/dL (ref 10–99)
NonHDL: 93.56
Total CHOL/HDL Ratio: 3
Triglycerides: 63 mg/dL (ref 10.0–149.0)
VLDL: 12.6 mg/dL (ref 0.0–40.0)

## 2024-05-16 LAB — TSH: TSH: 0.82 u[IU]/mL (ref 0.35–5.50)

## 2024-05-16 LAB — HEMOGLOBIN A1C: Hgb A1c MFr Bld: 5.4 % (ref 4.6–6.5)

## 2024-06-03 ENCOUNTER — Other Ambulatory Visit
